# Patient Record
Sex: Female | Born: 1950 | Race: White | Hispanic: No | Marital: Married | Smoking: Former smoker
Health system: Southern US, Community
[De-identification: ages and names within clinical notes are randomized; demographics above are authoritative.]

## PROBLEM LIST (undated history)

## (undated) DIAGNOSIS — F329 Major depressive disorder, single episode, unspecified: Secondary | ICD-10-CM

## (undated) DIAGNOSIS — K219 Gastro-esophageal reflux disease without esophagitis: Secondary | ICD-10-CM

## (undated) DIAGNOSIS — F32A Depression, unspecified: Secondary | ICD-10-CM

## (undated) DIAGNOSIS — I10 Essential (primary) hypertension: Secondary | ICD-10-CM

## (undated) DIAGNOSIS — E7800 Pure hypercholesterolemia, unspecified: Secondary | ICD-10-CM

## (undated) HISTORY — DX: Major depressive disorder, single episode, unspecified: F32.9

## (undated) HISTORY — DX: Gastro-esophageal reflux disease without esophagitis: K21.9

## (undated) HISTORY — DX: Pure hypercholesterolemia, unspecified: E78.00

## (undated) HISTORY — PX: BUNIONECTOMY: SHX129

## (undated) HISTORY — PX: TOE SURGERY: SHX1073

## (undated) HISTORY — DX: Essential (primary) hypertension: I10

## (undated) HISTORY — PX: TONSILLECTOMY: SUR1361

## (undated) HISTORY — DX: Depression, unspecified: F32.A

---

## 1999-05-17 ENCOUNTER — Other Ambulatory Visit: Admission: RE | Admit: 1999-05-17 | Discharge: 1999-05-17 | Payer: Self-pay | Admitting: Gynecology

## 2000-10-11 ENCOUNTER — Other Ambulatory Visit: Admission: RE | Admit: 2000-10-11 | Discharge: 2000-10-11 | Payer: Self-pay | Admitting: Gynecology

## 2001-01-27 ENCOUNTER — Other Ambulatory Visit: Admission: RE | Admit: 2001-01-27 | Discharge: 2001-01-27 | Payer: Self-pay | Admitting: Gynecology

## 2002-06-08 ENCOUNTER — Other Ambulatory Visit: Admission: RE | Admit: 2002-06-08 | Discharge: 2002-06-08 | Payer: Self-pay | Admitting: Gynecology

## 2003-11-08 ENCOUNTER — Other Ambulatory Visit: Admission: RE | Admit: 2003-11-08 | Discharge: 2003-11-08 | Payer: Self-pay | Admitting: Gynecology

## 2005-04-30 ENCOUNTER — Other Ambulatory Visit: Admission: RE | Admit: 2005-04-30 | Discharge: 2005-04-30 | Payer: Self-pay | Admitting: Gynecology

## 2006-05-01 ENCOUNTER — Other Ambulatory Visit: Admission: RE | Admit: 2006-05-01 | Discharge: 2006-05-01 | Payer: Self-pay | Admitting: Gynecology

## 2008-04-29 ENCOUNTER — Other Ambulatory Visit: Admission: RE | Admit: 2008-04-29 | Discharge: 2008-04-29 | Payer: Self-pay | Admitting: Gynecology

## 2010-05-04 ENCOUNTER — Other Ambulatory Visit: Admission: RE | Admit: 2010-05-04 | Discharge: 2010-05-04 | Payer: Self-pay | Admitting: Gynecology

## 2010-05-04 ENCOUNTER — Ambulatory Visit: Payer: Self-pay | Admitting: Gynecology

## 2010-08-16 ENCOUNTER — Ambulatory Visit: Payer: Self-pay | Admitting: Gynecology

## 2011-08-24 ENCOUNTER — Encounter: Payer: Self-pay | Admitting: Gynecology

## 2011-10-19 ENCOUNTER — Other Ambulatory Visit: Payer: Self-pay | Admitting: Orthopedic Surgery

## 2011-10-19 DIAGNOSIS — M25531 Pain in right wrist: Secondary | ICD-10-CM

## 2011-10-25 ENCOUNTER — Other Ambulatory Visit: Payer: Self-pay

## 2012-02-26 ENCOUNTER — Telehealth: Payer: Self-pay | Admitting: *Deleted

## 2012-02-26 NOTE — Telephone Encounter (Signed)
Pt called requesting rubella screen test, pt informed to fill out medical release form and result will be mailed to pt home. Pt will do as directed.

## 2012-06-05 ENCOUNTER — Telehealth: Payer: Self-pay | Admitting: *Deleted

## 2012-06-05 ENCOUNTER — Ambulatory Visit (INDEPENDENT_AMBULATORY_CARE_PROVIDER_SITE_OTHER): Payer: BC Managed Care – PPO | Admitting: Gynecology

## 2012-06-05 ENCOUNTER — Encounter: Payer: Self-pay | Admitting: Gynecology

## 2012-06-05 VITALS — BP 126/84 | Ht 62.5 in | Wt 116.0 lb

## 2012-06-05 DIAGNOSIS — E7800 Pure hypercholesterolemia, unspecified: Secondary | ICD-10-CM

## 2012-06-05 DIAGNOSIS — M810 Age-related osteoporosis without current pathological fracture: Secondary | ICD-10-CM

## 2012-06-05 DIAGNOSIS — I10 Essential (primary) hypertension: Secondary | ICD-10-CM | POA: Insufficient documentation

## 2012-06-05 DIAGNOSIS — Z01419 Encounter for gynecological examination (general) (routine) without abnormal findings: Secondary | ICD-10-CM

## 2012-06-05 DIAGNOSIS — F329 Major depressive disorder, single episode, unspecified: Secondary | ICD-10-CM | POA: Insufficient documentation

## 2012-06-05 DIAGNOSIS — Z131 Encounter for screening for diabetes mellitus: Secondary | ICD-10-CM

## 2012-06-05 LAB — CBC WITH DIFFERENTIAL/PLATELET
Basophils Absolute: 0.1 10*3/uL (ref 0.0–0.1)
HCT: 39.4 % (ref 36.0–46.0)
Hemoglobin: 12.6 g/dL (ref 12.0–15.0)
Lymphocytes Relative: 23 % (ref 12–46)
Monocytes Absolute: 0.4 10*3/uL (ref 0.1–1.0)
Neutro Abs: 3.3 10*3/uL (ref 1.7–7.7)
Neutrophils Relative %: 61 % (ref 43–77)
RDW: 13.6 % (ref 11.5–15.5)
WBC: 5.3 10*3/uL (ref 4.0–10.5)

## 2012-06-05 LAB — COMPREHENSIVE METABOLIC PANEL
ALT: 14 U/L (ref 0–35)
AST: 27 U/L (ref 0–37)
Albumin: 4.4 g/dL (ref 3.5–5.2)
Alkaline Phosphatase: 54 U/L (ref 39–117)
BUN: 12 mg/dL (ref 6–23)
Chloride: 102 mEq/L (ref 96–112)
Potassium: 4.4 mEq/L (ref 3.5–5.3)
Sodium: 138 mEq/L (ref 135–145)

## 2012-06-05 LAB — LIPID PANEL
Cholesterol: 258 mg/dL — ABNORMAL HIGH (ref 0–200)
LDL Cholesterol: 167 mg/dL — ABNORMAL HIGH (ref 0–99)
Total CHOL/HDL Ratio: 3.3 Ratio
VLDL: 12 mg/dL (ref 0–40)

## 2012-06-05 MED ORDER — RISEDRONATE SODIUM 150 MG PO TABS: 150.0000 mg | ORAL_TABLET | ORAL | Status: AC

## 2012-06-05 MED ORDER — NONFORMULARY OR COMPOUNDED ITEM: Status: DC

## 2012-06-05 NOTE — Addendum Note (Signed)
Addended by: Dara Lords on: 06/05/2012 12:18 PM   Modules accepted: Orders

## 2012-06-05 NOTE — Progress Notes (Signed)
Melanie Eaton 01-Jan-1951 [id number]        61 y.o.  [id number] for annual exam.  Several issues noted below.  Past medical history,surgical history, medications, allergies, family history and social history were all reviewed and documented in the EPIC chart. ROS:  Was performed and pertinent positives and negatives are included in the history.  Exam: Melanie Eaton assistant Filed Vitals:   06/05/12 0948  BP: 126/84  Height: 5' 2.5" (1.588 m)  Weight: 116 lb (52.617 kg)   General appearance  Normal Skin grossly normal Head/Neck normal with no cervical or supraclavicular adenopathy thyroid normal Lungs  clear Cardiac RR, without RMG Abdominal  soft, nontender, without masses, organomegaly or hernia Breasts  examined lying and sitting without masses, retractions, discharge or axillary adenopathy. Pelvic  Ext/BUS/vagina  normal with atrophic changes  Cervix  normal with atrophic changes  Uterus  axial, normal size, shape and contour, midline and mobile nontender   Adnexa  Without masses or tenderness    Anus and perineum  normal   Rectovaginal  normal sphincter tone without palpated masses or tenderness.    Assessment/Plan:  61 y.o. [id number] female for annual exam.   1. Decreased libido. Patient complaining of decreased libido which is bothersome to her husband. I reviewed the pathophysiology and options to include expectant management, behavior modification, hormonal treatment with estrogen/testosterone or testosterone alone. The issues of hormone replacement WHI study risk of stroke heart attack DVT increased risk of breast cancer all reviewed. The issues of testosterone replacement and side effect profile to include weight change acne hair growth unfavorable lipid profile given she is already treated for hypercholesterolemia. All this was discussed. Patient wants trial of testosterone a suggested 2% cream applied periclitorally nightly. 2. Osteoporosis. DEXA October 2011 showed T score -2.9. Had  tried both Fosamax and Actonel transiently but stopped ask about Prolia and Reclast from a convenience standpoint. I received daily again the risks benefits to include osteonecrosis of the jaw, atypical fractures, GERD esophageal cancer rashes overall infection issues all reviewed. My recommendation would be to restart Actonel monthly if convenience is the only issue and she agrees with this. Annual prescription provided. Schedule repeat DEXA after October 2 year interval.  Vitamin D level ordered. 3. Mammography. Patient is due in October and she knows to schedule this. SBE monthly reviewed. 4. Colonoscopy. Patient knows to schedule it this year if she is doing agrees to do so. 5. Pap smear. Last Pap smear 2011. No Pap smear done today. Patient has no history of abnormal Pap smears and will plan less frequent screening every 3-5 years. 6. Health maintenance. Patient has that I would do baseline labs and CBC comp as metabolic panel lipid profile urinalysis and vitamin D level ordered. Follow up in one year, sooner as needed.    Melanie Lords MD, 11:04 AM 06/05/2012

## 2012-06-05 NOTE — Telephone Encounter (Signed)
Message copied by Aura Camps on Thu Jun 05, 2012 12:21 PM ------      Message from: Dara Lords      Created: Thu Jun 05, 2012 12:19 PM       Call patient and tell her that [facility] will make for her and I sent them the prescrition a

## 2012-06-05 NOTE — Patient Instructions (Signed)
Start Actonel monthly as we discussed. Start testosterone cream nightly fingertip amount applied around clitoral region.  May decrease to several times weekly if achieves desirable result. Follow up in one year for annual gynecologic exam.

## 2012-06-05 NOTE — Telephone Encounter (Signed)
Left the below note on pt voicemail. 

## 2012-06-06 ENCOUNTER — Other Ambulatory Visit: Payer: Self-pay | Admitting: Gynecology

## 2012-06-06 ENCOUNTER — Telehealth: Payer: Self-pay | Admitting: *Deleted

## 2012-06-06 LAB — URINALYSIS W MICROSCOPIC + REFLEX CULTURE
Bacteria, UA: NONE SEEN
Bilirubin Urine: NEGATIVE
Crystals: NONE SEEN
Glucose, UA: NEGATIVE mg/dL
Ketones, ur: NEGATIVE mg/dL
Protein, ur: NEGATIVE mg/dL
Squamous Epithelial / LPF: NONE SEEN

## 2012-06-06 MED ORDER — IBANDRONATE SODIUM 150 MG PO TABS: 150.0000 mg | ORAL_TABLET | ORAL | Status: AC

## 2012-06-06 NOTE — Telephone Encounter (Signed)
Prior authorization approved until 06/05/2013 for Actonel 150 mg tablets [facility] pharmacy informed with this as well.

## 2012-06-10 ENCOUNTER — Encounter: Payer: Self-pay | Admitting: Gynecology

## 2012-08-19 ENCOUNTER — Telehealth: Payer: Self-pay | Admitting: *Deleted

## 2012-08-19 NOTE — Telephone Encounter (Signed)
(  pt aware you are out of the office)Pt calling c/o yeast infection, itching, white discharge. 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 Please advise

## 2012-08-20 MED ORDER — FLUCONAZOLE 150 MG PO TABS: 150.0000 mg | ORAL_TABLET | Freq: Once | ORAL | Status: DC

## 2012-08-20 NOTE — Telephone Encounter (Signed)
Left on message rx sent.

## 2012-08-20 NOTE — Telephone Encounter (Signed)
Diflucan 150mg x 1

## 2012-08-27 ENCOUNTER — Encounter: Payer: Self-pay | Admitting: Gynecology

## 2013-06-09 ENCOUNTER — Other Ambulatory Visit: Payer: Self-pay | Admitting: Gynecology

## 2013-06-09 ENCOUNTER — Other Ambulatory Visit (HOSPITAL_COMMUNITY)
Admission: RE | Admit: 2013-06-09 | Discharge: 2013-06-09 | Disposition: A | Discharge status: Home / Self Care | Payer: BC Managed Care – PPO | Source: Ambulatory Visit | Attending: Gynecology | Admitting: Gynecology

## 2013-06-09 ENCOUNTER — Encounter: Payer: Self-pay | Admitting: Gynecology

## 2013-06-09 ENCOUNTER — Ambulatory Visit (INDEPENDENT_AMBULATORY_CARE_PROVIDER_SITE_OTHER): Payer: BC Managed Care – PPO | Admitting: Gynecology

## 2013-06-09 VITALS — BP 120/76 | Ht 62.0 in | Wt 116.0 lb

## 2013-06-09 DIAGNOSIS — Z01419 Encounter for gynecological examination (general) (routine) without abnormal findings: Secondary | ICD-10-CM

## 2013-06-09 DIAGNOSIS — Z1151 Encounter for screening for human papillomavirus (HPV): Secondary | ICD-10-CM | POA: Insufficient documentation

## 2013-06-09 DIAGNOSIS — M810 Age-related osteoporosis without current pathological fracture: Secondary | ICD-10-CM

## 2013-06-09 DIAGNOSIS — N760 Acute vaginitis: Secondary | ICD-10-CM

## 2013-06-09 DIAGNOSIS — Z13220 Encounter for screening for lipoid disorders: Secondary | ICD-10-CM

## 2013-06-09 LAB — CBC WITH DIFFERENTIAL/PLATELET
Basophils Absolute: 0.1 10*3/uL (ref 0.0–0.1)
Basophils Relative: 1 % (ref 0–1)
Eosinophils Absolute: 0.5 10*3/uL (ref 0.0–0.7)
MCH: 31.7 pg (ref 26.0–34.0)
MCHC: 33.9 g/dL (ref 30.0–36.0)
Neutrophils Relative %: 61 % (ref 43–77)
Platelets: 233 10*3/uL (ref 150–400)
RDW: 13.5 % (ref 11.5–15.5)

## 2013-06-09 LAB — COMPREHENSIVE METABOLIC PANEL
ALT: 15 U/L (ref 0–35)
Alkaline Phosphatase: 48 U/L (ref 39–117)
Glucose, Bld: 77 mg/dL (ref 70–99)
Sodium: 135 mEq/L (ref 135–145)
Total Bilirubin: 1.1 mg/dL (ref 0.3–1.2)
Total Protein: 6.5 g/dL (ref 6.0–8.3)

## 2013-06-09 LAB — TSH: TSH: 3.367 u[IU]/mL (ref 0.350–4.500)

## 2013-06-09 LAB — LIPID PANEL
LDL Cholesterol: 170 mg/dL — ABNORMAL HIGH (ref 0–99)
Triglycerides: 69 mg/dL (ref <150)
VLDL: 14 mg/dL (ref 0–40)

## 2013-06-09 MED ORDER — RISEDRONATE SODIUM 150 MG PO TABS: 150.0000 mg | ORAL_TABLET | ORAL | Status: DC

## 2013-06-09 MED ORDER — FLUCONAZOLE 150 MG PO TABS: 150.0000 mg | ORAL_TABLET | Freq: Once | ORAL | Status: DC

## 2013-06-09 NOTE — Progress Notes (Signed)
Melanie Eaton [birth year] [id number]        62 y.o.  [id number] for annual exam.  Several issues noted below.  Past medical history,surgical history, medications, allergies, family history and social history were all reviewed and documented in the EPIC chart.  ROS:  Performed and pertinent positives and negatives are included in the history, assessment and plan .  Exam: Kim assistant Filed Vitals:   06/09/13 1132  BP: 120/76  Height: 5\' 2"  (1.575 m)  Weight: 116 lb (52.617 kg)   General appearance  Normal Skin grossly normal Head/Neck normal with no cervical or supraclavicular adenopathy thyroid normal Lungs  clear Cardiac RR, without RMG Abdominal  soft, nontender, without masses, organomegaly or hernia Breasts  examined lying and sitting without masses, retractions, discharge or axillary adenopathy. Pelvic  Ext/BUS/vagina  normal with atrophic changes  Cervix  normal with atrophic changes  Uterus  anteverted, normal size, shape and contour, midline and mobile nontender   Adnexa  Without masses or tenderness    Anus and perineum  normal   Rectovaginal  normal sphincter tone without palpated masses or tenderness.    Assessment/Plan:  62 y.o. [id number] female for annual exam.   1. Postmenopausal/atrophic vaginitis. Overall doing well without significant hot flashes night sweats vaginal dryness or dyspareunia. Continue to monitor. Report any bleeding. 2. Osteoporosis. DEXA 08/2010 with T score -2.9. Most supposed to repeat last year but never did. Transiently started Actonel last year but stopped. Not an issue of side effects but she states having trouble remembering. I had a frank discussion with her about the devastating consequences of osteoporosis and fracture. Unable to reverse the changes discussed. The real need for treatment now reviewed. I again discussed the risks of osteonecrosis of the jaw, atypical fractures, GERD and esophageal disease. The patient promises me she'll restart  the Actonel and I refilled her times a year. We'll check baseline TSH PTH vitamin D and I again scheduled the DEXA and she agrees to followup and schedule this and knows the importance of doing so. 3. Mammography 08/2012. Patient will schedule repeat mammography this October. SBE monthly reviewed. 4. Pap smear 2011. Pap/HPV today. No history of abnormal Pap smears previously. Plan repeat assuming this one normal at 3-5 year interval. 5. Colonoscopy never. I again reviewed the benefits of early detection and precancerous lesion removal. I discussed the common nature of colon cancer and the need to schedule a colonoscopy. Patient promised me she would schedule colonoscopy this year.  6. Decreased libido. Tried testosterone cream previously without effect. No active intervention at this time. 7. Health maintenance. The patient asked if I would do her routine blood work as she is fasting and she will forward to her primary physician via MyChart as she is being followed for medical issues. Baseline CBC comprehensive metabolic panel lipid profile urinalysis along with her TSH vitamin D PTH.  Note: This document was prepared with digital dictation and possible smart phrase technology. Any transcriptional errors that result from this process are unintentional.   Dara Lords MD, 11:58 AM 06/09/2013

## 2013-06-09 NOTE — Patient Instructions (Signed)
Schedule screening colonoscopy. Schedule bone density. Restart Actonel. Followup with your medical doctor for your medical issues. Followup with me in one year, sooner as needed.

## 2013-06-09 NOTE — Addendum Note (Signed)
Addended by: Dayna Barker on: 06/09/2013 12:23 PM   Modules accepted: Orders

## 2013-06-10 LAB — URINALYSIS W MICROSCOPIC + REFLEX CULTURE
Crystals: NONE SEEN
Nitrite: NEGATIVE
Specific Gravity, Urine: 1.02 (ref 1.005–1.030)
Urobilinogen, UA: 1 mg/dL (ref 0.0–1.0)

## 2013-06-11 LAB — URINE CULTURE: Organism ID, Bacteria: NO GROWTH

## 2014-08-20 ENCOUNTER — Telehealth: Payer: Self-pay | Admitting: *Deleted

## 2014-08-20 NOTE — Telephone Encounter (Signed)
I have an appointment with Dr. Paulla Dolly next week.  My foot is bothering me again.  I wanted to know if he could go ahead and call an anti-inflammatory in for me.  I have an appointment on Wednesday.  If you would have him do that before I get in there, that would be great.

## 2014-08-22 NOTE — Telephone Encounter (Signed)
[id number] q.d. #[phone]

## 2014-08-23 MED ORDER — MELOXICAM 15 MG PO TABS: 15.0000 mg | ORAL_TABLET | Freq: Every day | ORAL | Status: DC

## 2014-08-23 NOTE — Telephone Encounter (Signed)
I left her a message that Dr. Josephina Shih the prescription for Mobic.  I'm sending it to [facility] in [place].  Call if you have any further questions.

## 2014-08-25 ENCOUNTER — Ambulatory Visit (INDEPENDENT_AMBULATORY_CARE_PROVIDER_SITE_OTHER): Payer: BC Managed Care – PPO | Admitting: Podiatry

## 2014-08-25 ENCOUNTER — Ambulatory Visit (INDEPENDENT_AMBULATORY_CARE_PROVIDER_SITE_OTHER): Payer: BC Managed Care – PPO

## 2014-08-25 ENCOUNTER — Encounter: Payer: Self-pay | Admitting: Podiatry

## 2014-08-25 ENCOUNTER — Telehealth: Payer: Self-pay | Admitting: *Deleted

## 2014-08-25 VITALS — BP 160/96 | HR 73 | Resp 16

## 2014-08-25 DIAGNOSIS — M79673 Pain in unspecified foot: Secondary | ICD-10-CM

## 2014-08-25 DIAGNOSIS — M2021 Hallux rigidus, right foot: Secondary | ICD-10-CM

## 2014-08-25 DIAGNOSIS — M779 Enthesopathy, unspecified: Secondary | ICD-10-CM

## 2014-08-25 MED ORDER — TRIAMCINOLONE ACETONIDE 10 MG/ML IJ SUSP
10.0000 mg | Freq: Once | INTRAMUSCULAR | Status: AC
  Administered 2014-08-25: 10 mg

## 2014-08-25 NOTE — Telephone Encounter (Signed)
"  I was just in there about 10 minutes ago.  I saw Dr. Paulla Dolly.  He was supposed to give me an anti-inflammatory. When I walked out I forgot about it.  I got fitted for orthotics.  If you would call me back.  Thanks a lot."  I called and informed her that we had sent a prescription to [facility] on yesterday.  (It was actually sent on Monday.)  She stated, "Okay thank you."

## 2014-08-26 NOTE — Progress Notes (Signed)
Subjective:     Patient ID: Melanie Eaton, female   DOB: 29-Nov-1950, 63 y.o.   MRN: [id number]  HPI patient presents stating I'm doing okay but having pain in my big toe joint right foot that just started recently but did very well with medication. The orthotics help me a lot   Review of Systems     Objective:   Physical Exam Neurovascular status intact with muscle strength adequate range of motion within normal limits. 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 Ultimately she knows this will require correction but at this time we'll continue to treat can certainly and I injected around the right first MPJ 3 mg Kenalog 5 mg Xylocaine and I went ahead and I scanned for new type orthotics with a graphite extension it's. Reappoint when ready and she will want a second pair we will make that decision based on response

## 2014-09-06 ENCOUNTER — Encounter: Payer: Self-pay | Admitting: Podiatry

## 2014-09-16 ENCOUNTER — Telehealth: Payer: Self-pay | Admitting: *Deleted

## 2014-09-16 NOTE — Telephone Encounter (Signed)
"  Calling to see if my orthotics are in.  If you'd return my message, I would appreciate it."  I called and left her a message that her orthotics are here.  Please give [place] a call to schedule an appointment to pick them up.

## 2014-09-20 ENCOUNTER — Telehealth: Payer: Self-pay | Admitting: *Deleted

## 2014-09-20 NOTE — Telephone Encounter (Signed)
Pt called requesting order faxed to [facility] for bone density. Order faxed pt will call to schedule.

## 2014-09-23 ENCOUNTER — Encounter: Payer: Self-pay | Admitting: Podiatry

## 2014-09-24 ENCOUNTER — Other Ambulatory Visit: Payer: BC Managed Care – PPO

## 2014-11-03 ENCOUNTER — Encounter: Payer: Self-pay | Admitting: Gynecology

## 2014-11-10 ENCOUNTER — Encounter: Payer: Self-pay | Admitting: Gynecology

## 2014-11-18 ENCOUNTER — Encounter: Payer: BC Managed Care – PPO | Admitting: Gynecology

## 2014-11-29 ENCOUNTER — Encounter: Payer: Self-pay | Admitting: Gynecology

## 2014-12-27 ENCOUNTER — Ambulatory Visit (INDEPENDENT_AMBULATORY_CARE_PROVIDER_SITE_OTHER): Payer: BC Managed Care – PPO | Admitting: Gynecology

## 2014-12-27 ENCOUNTER — Encounter: Payer: Self-pay | Admitting: Gynecology

## 2014-12-27 VITALS — BP 120/76 | Ht 63.0 in | Wt 124.0 lb

## 2014-12-27 DIAGNOSIS — E7800 Pure hypercholesterolemia, unspecified: Secondary | ICD-10-CM

## 2014-12-27 DIAGNOSIS — Z01419 Encounter for gynecological examination (general) (routine) without abnormal findings: Secondary | ICD-10-CM

## 2014-12-27 DIAGNOSIS — M810 Age-related osteoporosis without current pathological fracture: Secondary | ICD-10-CM

## 2014-12-27 LAB — LIPID PANEL
CHOL/HDL RATIO: 3 ratio
CHOLESTEROL: 274 mg/dL — AB (ref 0–200)
HDL: 91 mg/dL (ref >=46)
LDL Cholesterol: 168 mg/dL — ABNORMAL HIGH (ref 0–99)
TRIGLYCERIDES: 75 mg/dL (ref <150)
VLDL: 15 mg/dL (ref 0–40)

## 2014-12-27 LAB — CBC WITH DIFFERENTIAL/PLATELET
BASOS PCT: 1 % (ref 0–1)
Basophils Absolute: 0 10*3/uL (ref 0.0–0.1)
EOS PCT: 10 % — AB (ref 0–5)
Eosinophils Absolute: 0.5 10*3/uL (ref 0.0–0.7)
HCT: 40.9 % (ref 36.0–46.0)
Hemoglobin: 13.7 g/dL (ref 12.0–15.0)
LYMPHS ABS: 1.1 10*3/uL (ref 0.7–4.0)
Lymphocytes Relative: 25 % (ref 12–46)
MCH: 32.3 pg (ref 26.0–34.0)
MCHC: 33.5 g/dL (ref 30.0–36.0)
MCV: 96.5 fL (ref 78.0–100.0)
MPV: 9.1 fL (ref 8.6–12.4)
Monocytes Absolute: 0.4 10*3/uL (ref 0.1–1.0)
Monocytes Relative: 8 % (ref 3–12)
NEUTROS ABS: 2.5 10*3/uL (ref 1.7–7.7)
Neutrophils Relative %: 56 % (ref 43–77)
Platelets: 217 10*3/uL (ref 150–400)
RBC: 4.24 MIL/uL (ref 3.87–5.11)
RDW: 13 % (ref 11.5–15.5)
WBC: 4.5 10*3/uL (ref 4.0–10.5)

## 2014-12-27 LAB — COMPREHENSIVE METABOLIC PANEL
ALK PHOS: 49 U/L (ref 39–117)
ALT: 12 U/L (ref 0–35)
AST: 21 U/L (ref 0–37)
Albumin: 4.4 g/dL (ref 3.5–5.2)
BUN: 16 mg/dL (ref 6–23)
CO2: 28 mEq/L (ref 19–32)
Calcium: 9.3 mg/dL (ref 8.4–10.5)
Chloride: 100 mEq/L (ref 96–112)
Creat: 0.74 mg/dL (ref 0.50–1.10)
Glucose, Bld: 90 mg/dL (ref 70–99)
Potassium: 4.4 mEq/L (ref 3.5–5.3)
Sodium: 137 mEq/L (ref 135–145)
Total Bilirubin: 0.7 mg/dL (ref 0.2–1.2)
Total Protein: 6.5 g/dL (ref 6.0–8.3)

## 2014-12-27 LAB — TSH: TSH: 3.741 u[IU]/mL (ref 0.350–4.500)

## 2014-12-27 MED ORDER — RISEDRONATE SODIUM 150 MG PO TABS: 150.0000 mg | ORAL_TABLET | ORAL | Status: DC

## 2014-12-27 MED ORDER — FLUCONAZOLE 150 MG PO TABS: 150.0000 mg | ORAL_TABLET | Freq: Once | ORAL | Status: DC

## 2014-12-27 NOTE — Patient Instructions (Signed)
Schedule colonoscopy with [facility] at [phone] or [facility] at [phone]  Start on the monthly Actonel bone medication. Call me if you have any issues.  You may obtain a copy of any labs that were done today by logging onto MyChart as outlined in the instructions provided with your AVS (after visit summary). The office will not call with normal lab results but certainly if there are any significant abnormalities then we will contact you.   Health Maintenance, Female A healthy lifestyle and preventative care can promote health and wellness.  Maintain regular health, dental, and eye exams.  Eat a healthy diet. Foods like vegetables, fruits, whole grains, low-fat dairy products, and lean protein foods contain the nutrients you need without too many calories. Decrease your intake of foods high in solid fats, added sugars, and salt. Get information about a proper diet from your caregiver, if necessary.  Regular physical exercise is one of the most important things you can do for your health. Most adults should get at least 150 minutes of moderate-intensity exercise (any activity that increases your heart rate and causes you to sweat) each week. In addition, most adults need muscle-strengthening exercises on 2 or more days a week.   Maintain a healthy weight. The body mass index (BMI) is a screening tool to identify possible weight problems. It provides an estimate of body fat based on height and weight. Your caregiver can help determine your BMI, and can help you achieve or maintain a healthy weight. For adults 20 years and older:  A BMI below 18.5 is considered underweight.  A BMI of 18.5 to 24.9 is normal.  A BMI of 25 to 29.9 is considered overweight.  A BMI of 30 and above is considered obese.  Maintain normal blood lipids and cholesterol by exercising and minimizing your intake of saturated fat. Eat a balanced diet with plenty of fruits and vegetables.  Blood tests for lipids and cholesterol should begin at age 55 and be repeated every 5 years. If your lipid or cholesterol levels are high, you are over 50, or you are a high risk for heart disease, you may need your cholesterol levels checked more frequently.Ongoing high lipid and cholesterol levels should be treated with medicines if diet and exercise are not effective.  If you smoke, find out from your caregiver how to quit. If you do not use tobacco, do not start.  Lung cancer screening is recommended for adults aged 45 80 years who are at high risk for developing lung cancer because of a history of smoking. Yearly low-dose computed tomography (CT) is recommended for people who have at least a 30-pack-year history of smoking and are a current smoker or have quit within the past 15 years. A pack year of smoking is smoking an average of 1 pack of cigarettes a day for 1 year (for example: 1 pack a day for 30 years or 2 packs a day for 15 years). Yearly screening should continue until the smoker has stopped smoking for at least 15 years. Yearly screening should also be stopped for people who develop a health problem that would prevent them from having lung cancer treatment.  If you are pregnant, do not drink alcohol. If you are breastfeeding, be very cautious about drinking alcohol. If you are not pregnant and choose to drink alcohol, do not exceed 1 drink per day. One drink is considered to be 12 ounces (355 mL) of beer, 5 ounces (148 mL) of wine, or 1.5  ounces (44 mL) of liquor.  Avoid use of street drugs. Do not share needles with anyone. Ask for help if you need support or instructions about stopping the use of drugs.  High blood pressure causes heart disease and increases the risk of stroke. Blood pressure should be checked at least every 1 to 2 years. Ongoing high blood pressure should be treated with medicines, if weight loss and exercise are not effective.  If you are 45 to 62 years old, ask your  caregiver if you should take aspirin to prevent strokes.  Diabetes screening involves taking a blood sample to check your fasting blood sugar level. This should be done once every 3 years, after age 35, if you are within normal weight and without risk factors for diabetes. Testing should be considered at a younger age or be carried out more frequently if you are overweight and have at least 1 risk factor for diabetes.  Breast cancer screening is essential preventative care for women. You should practice "breast self-awareness." This means understanding the normal appearance and feel of your breasts and may include breast self-examination. Any changes detected, no matter how small, should be reported to a caregiver. Women in their 53s and 30s should have a clinical breast exam (CBE) by a caregiver as part of a regular health exam every 1 to 3 years. After age 45, women should have a CBE every year. Starting at age 1, women should consider having a mammogram (breast X-ray) every year. Women who have a family history of breast cancer should talk to their caregiver about genetic screening. Women at a high risk of breast cancer should talk to their caregiver about having an MRI and a mammogram every year.  Breast cancer gene (BRCA)-related cancer risk assessment is recommended for women who have family members with BRCA-related cancers. BRCA-related cancers include breast, ovarian, tubal, and peritoneal cancers. Having family members with these cancers may be associated with an increased risk for harmful changes (mutations) in the breast cancer genes BRCA1 and BRCA2. Results of the assessment will determine the need for genetic counseling and BRCA1 and BRCA2 testing.  The Pap test is a screening test for cervical cancer. Women should have a Pap test starting at age 11. Between ages 13 and 53, Pap tests should be repeated every 2 years. Beginning at age 43, you should have a Pap test every 3 years as long as the  past 3 Pap tests have been normal. If you had a hysterectomy for a problem that was not cancer or a condition that could lead to cancer, then you no longer need Pap tests. If you are between ages 30 and 41, and you have had normal Pap tests going back 10 years, you no longer need Pap tests. If you have had past treatment for cervical cancer or a condition that could lead to cancer, you need Pap tests and screening for cancer for at least 20 years after your treatment. If Pap tests have been discontinued, risk factors (such as a new sexual partner) need to be reassessed to determine if screening should be resumed. Some women have medical problems that increase the chance of getting cervical cancer. In these cases, your caregiver may recommend more frequent screening and Pap tests.  The human papillomavirus (HPV) test is an additional test that may be used for cervical cancer screening. The HPV test looks for the virus that can cause the cell changes on the cervix. The cells collected during the Pap test  can be tested for HPV. The HPV test could be used to screen women aged 71 years and older, and should be used in women of any age who have unclear Pap test results. After the age of 36, women should have HPV testing at the same frequency as a Pap test.  Colorectal cancer can be detected and often prevented. Most routine colorectal cancer screening begins at the age of 37 and continues through age 55. However, your caregiver may recommend screening at an earlier age if you have risk factors for colon cancer. On a yearly basis, your caregiver may provide home test kits to check for hidden blood in the stool. Use of a small camera at the end of a tube, to directly examine the colon (sigmoidoscopy or colonoscopy), can detect the earliest forms of colorectal cancer. Talk to your caregiver about this at age 41, when routine screening begins. Direct examination of the colon should be repeated every 5 to 10 years through  age 12, unless early forms of pre-cancerous polyps or small growths are found.  Hepatitis C blood testing is recommended for all people born from [birth year] through 1963 and any individual with known risks for hepatitis C.  Practice safe sex. Use condoms and avoid high-risk sexual practices to reduce the spread of sexually transmitted infections (STIs). Sexually active women aged 34 and younger should be checked for Chlamydia, which is a common sexually transmitted infection. Older women with new or multiple partners should also be tested for Chlamydia. Testing for other STIs is recommended if you are sexually active and at increased risk.  Osteoporosis is a disease in which the bones lose minerals and strength with aging. This can result in serious bone fractures. The risk of osteoporosis can be identified using a bone density scan. Women ages 2 and over and women at risk for fractures or osteoporosis should discuss screening with their caregivers. Ask your caregiver whether you should be taking a calcium supplement or vitamin D to reduce the rate of osteoporosis.  Menopause can be associated with physical symptoms and risks. Hormone replacement therapy is available to decrease symptoms and risks. You should talk to your caregiver about whether hormone replacement therapy is right for you.  Use sunscreen. Apply sunscreen liberally and repeatedly throughout the day. You should seek shade when your shadow is shorter than you. Protect yourself by wearing long sleeves, pants, a wide-brimmed hat, and sunglasses year round, whenever you are outdoors.  Notify your caregiver of new moles or changes in moles, especially if there is a change in shape or color. Also notify your caregiver if a mole is larger than the size of a pencil eraser.  Stay current with your immunizations. Document Released: 05/07/2011 Document Revised: 02/16/2013 Document Reviewed: 05/07/2011 [facility] Patient Information 2014 [place].

## 2014-12-27 NOTE — Progress Notes (Signed)
Melanie Eaton May 26, 1951 [id number]        64 y.o.  [id number] for annual exam.  Several issues noted below.  Past medical history,surgical history, problem list, medications, allergies, family history and social history were all reviewed and documented as reviewed in the EPIC chart.  ROS:  Performed with pertinent positives and negatives included in the history, assessment and plan.   Additional significant findings :  none   Exam: Kim Counsellor Vitals:   12/27/14 1034  BP: 120/76  Height: 5\' 3"  (1.6 m)  Weight: 124 lb (56.246 kg)   General appearance:  Normal affect, orientation and appearance. Skin: Grossly normal HEENT: Without gross lesions.  No cervical or supraclavicular adenopathy. Thyroid normal.  Lungs:  Clear without wheezing, rales or rhonchi Cardiac: RR, without RMG Abdominal:  Soft, nontender, without masses, guarding, rebound, organomegaly or hernia Breasts:  Examined lying and sitting without masses, retractions, discharge or axillary adenopathy. Pelvic:  Ext/BUS/vagina with atrophic changes  Cervix with atrophic changes  Uterus anteverted, normal size, shape and contour, midline and mobile nontender   Adnexa  Without masses or tenderness    Anus and perineum  Normal   Rectovaginal  Normal sphincter tone without palpated masses or tenderness.    Assessment/Plan:  64 y.o. [id number] female for annual exam.   1. Postmenopausal/atrophic genital changes. Patient without significant symptoms of hot flashes, night sweats, vaginal dryness or any vaginal bleeding. Continue to monitor and report any vaginal bleeding. 2. Osteoporosis.  DEXA 10/2014 T score -2.8. Transiently took Actonel but stopped it. Normal vitamin D, TSH and PTH. I reviewed her increased risk of fracture and my recommendation to consider restarting the Actonel and she did well when she took it.  I again reviewed the risks to include GI, osteonecrosis of the jaw atypical fractures. Patient wants to go  ahead and start this I prescribed one pill with 12 refills. How to take the medication again reviewed. Plan repeat DEXA at a two-year interval. Check vitamin D level today. She does exercise on a regular basis. 3. Mammography 11/2014. Continue with annual mammography. SBE monthly reviewed. 4. Pap smear/HPV negative 06/2013.  No Pap smear done today.  No history of abnormal Pap smears previously. Plan repeat Pap smear at 3-5 year interval. 5. Colonoscopy never. I again strongly urged her to schedule a screening colonoscopy. Patient again promises to do this year. Benefits of early detection/precancerous polyp removal reviewed. Names and numbers provided. 6. Health maintenance. Baseline CBC comprehensive metabolic panel lipid profile urinalysis vitamin D TSH ordered. Follow up 1 year, sooner as needed.     [name] MD, 11:00 AM 12/27/2014

## 2014-12-28 ENCOUNTER — Other Ambulatory Visit: Payer: Self-pay | Admitting: Gynecology

## 2014-12-28 DIAGNOSIS — E559 Vitamin D deficiency, unspecified: Secondary | ICD-10-CM

## 2014-12-28 LAB — URINALYSIS W MICROSCOPIC + REFLEX CULTURE
Bacteria, UA: NONE SEEN
Bilirubin Urine: NEGATIVE
Casts: NONE SEEN
Crystals: NONE SEEN
GLUCOSE, UA: NEGATIVE mg/dL
Hgb urine dipstick: NEGATIVE
Ketones, ur: NEGATIVE mg/dL
Nitrite: NEGATIVE
Protein, ur: NEGATIVE mg/dL
SQUAMOUS EPITHELIAL / LPF: NONE SEEN
Specific Gravity, Urine: 1.012 (ref 1.005–1.030)
Urobilinogen, UA: 0.2 mg/dL (ref 0.0–1.0)
pH: 7 (ref 5.0–8.0)

## 2014-12-28 LAB — VITAMIN D 25 HYDROXY (VIT D DEFICIENCY, FRACTURES): VIT D 25 HYDROXY: 25 ng/mL — AB (ref 30–100)

## 2014-12-29 LAB — URINE CULTURE
Colony Count: NO GROWTH
Organism ID, Bacteria: NO GROWTH

## 2015-10-12 HISTORY — PX: OTHER SURGICAL HISTORY: SHX169

## 2015-11-30 ENCOUNTER — Ambulatory Visit (INDEPENDENT_AMBULATORY_CARE_PROVIDER_SITE_OTHER): Payer: BC Managed Care – PPO | Admitting: Podiatry

## 2015-11-30 ENCOUNTER — Encounter: Payer: Self-pay | Admitting: Podiatry

## 2015-11-30 VITALS — BP 139/89 | HR 60 | Resp 16

## 2015-11-30 DIAGNOSIS — M2021 Hallux rigidus, right foot: Secondary | ICD-10-CM

## 2015-11-30 DIAGNOSIS — M779 Enthesopathy, unspecified: Secondary | ICD-10-CM | POA: Diagnosis not present

## 2015-11-30 MED ORDER — TRIAMCINOLONE ACETONIDE 10 MG/ML IJ SUSP
10.0000 mg | Freq: Once | INTRAMUSCULAR | Status: AC
  Administered 2015-11-30: 10 mg

## 2015-11-30 MED ORDER — MELOXICAM 15 MG PO TABS: 15.0000 mg | ORAL_TABLET | Freq: Every day | ORAL | Status: DC

## 2015-12-01 NOTE — Progress Notes (Signed)
Subjective:     Patient ID: Melanie Eaton, female   DOB: [birth year], 65 y.o.   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

## 2015-12-21 ENCOUNTER — Ambulatory Visit: Payer: BC Managed Care – PPO | Admitting: *Deleted

## 2015-12-21 DIAGNOSIS — M779 Enthesopathy, unspecified: Secondary | ICD-10-CM

## 2015-12-21 NOTE — Patient Instructions (Signed)

## 2015-12-21 NOTE — Progress Notes (Signed)
Patient ID: Melanie Eaton, female   DOB: [birth year], 65 y.o.   MRN: [id number] Patient presents for orthotic pick up.  Verbal and written break in and wear instructions given.  Patient will follow up in 4 weeks if symptoms worsen or fail to improve.

## 2016-08-07 ENCOUNTER — Encounter: Payer: Self-pay | Admitting: Gynecology

## 2016-08-15 DIAGNOSIS — M5137 Other intervertebral disc degeneration, lumbosacral region: Secondary | ICD-10-CM | POA: Insufficient documentation

## 2016-08-15 DIAGNOSIS — R5381 Other malaise: Secondary | ICD-10-CM | POA: Insufficient documentation

## 2016-08-15 DIAGNOSIS — F988 Other specified behavioral and emotional disorders with onset usually occurring in childhood and adolescence: Secondary | ICD-10-CM | POA: Insufficient documentation

## 2016-08-15 DIAGNOSIS — M810 Age-related osteoporosis without current pathological fracture: Secondary | ICD-10-CM | POA: Insufficient documentation

## 2016-08-15 DIAGNOSIS — R5383 Other fatigue: Secondary | ICD-10-CM

## 2016-08-16 DIAGNOSIS — G250 Essential tremor: Secondary | ICD-10-CM | POA: Insufficient documentation

## 2016-11-13 ENCOUNTER — Telehealth: Payer: Self-pay | Admitting: *Deleted

## 2016-11-13 NOTE — Telephone Encounter (Signed)
Pt requested refill of Meloxicam. I left message informing pt if she was continuing to have a problem with pain that needed a refill of the antiinflammatory pain medication Meloxicam, she needed to schedule an appt.

## 2016-12-31 ENCOUNTER — Ambulatory Visit (INDEPENDENT_AMBULATORY_CARE_PROVIDER_SITE_OTHER): Payer: BC Managed Care – PPO | Admitting: Podiatry

## 2016-12-31 ENCOUNTER — Encounter: Payer: Self-pay | Admitting: Podiatry

## 2016-12-31 ENCOUNTER — Ambulatory Visit (INDEPENDENT_AMBULATORY_CARE_PROVIDER_SITE_OTHER): Payer: BC Managed Care – PPO

## 2016-12-31 VITALS — BP 152/91 | HR 66 | Resp 16

## 2016-12-31 DIAGNOSIS — M2021 Hallux rigidus, right foot: Secondary | ICD-10-CM | POA: Diagnosis not present

## 2016-12-31 DIAGNOSIS — M79671 Pain in right foot: Secondary | ICD-10-CM | POA: Diagnosis not present

## 2016-12-31 DIAGNOSIS — M779 Enthesopathy, unspecified: Secondary | ICD-10-CM

## 2016-12-31 DIAGNOSIS — M79672 Pain in left foot: Secondary | ICD-10-CM | POA: Diagnosis not present

## 2016-12-31 MED ORDER — MELOXICAM 15 MG PO TABS: 15.0000 mg | ORAL_TABLET | Freq: Every day | ORAL | 2 refills | Status: DC

## 2016-12-31 MED ORDER — TRIAMCINOLONE ACETONIDE 10 MG/ML IJ SUSP
10.0000 mg | Freq: Once | INTRAMUSCULAR | Status: AC
  Administered 2016-12-31: 10 mg

## 2017-01-02 NOTE — Progress Notes (Signed)
Subjective:     Patient ID: Melanie Eaton, female   DOB: 01/19/1951, 66 y.o.   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 At this time I went ahead and I injected around the capsule 3 mg Kenalog 5 mg Xylocaine and did scanned for new orthotic to try to reduce stress against the joint surface. I then educated her on hallux limitus and we discussed surgical procedures that may be necessary in this case  X-ray indicates moderate increase and spur size with narrowing the joint surface

## 2017-01-21 ENCOUNTER — Other Ambulatory Visit: Payer: BC Managed Care – PPO

## 2017-02-13 ENCOUNTER — Other Ambulatory Visit: Payer: BC Managed Care – PPO

## 2017-02-20 ENCOUNTER — Other Ambulatory Visit: Payer: BC Managed Care – PPO

## 2017-09-20 ENCOUNTER — Other Ambulatory Visit: Payer: Self-pay | Admitting: Gynecology

## 2017-09-20 DIAGNOSIS — Z1231 Encounter for screening mammogram for malignant neoplasm of breast: Secondary | ICD-10-CM

## 2017-10-15 ENCOUNTER — Ambulatory Visit: Payer: BC Managed Care – PPO | Admitting: Gynecology

## 2017-10-15 ENCOUNTER — Other Ambulatory Visit: Payer: Self-pay | Admitting: Gynecology

## 2017-10-15 ENCOUNTER — Encounter: Payer: Self-pay | Admitting: Gynecology

## 2017-10-15 VITALS — BP 120/76 | Ht 62.0 in | Wt 127.0 lb

## 2017-10-15 DIAGNOSIS — Z01411 Encounter for gynecological examination (general) (routine) with abnormal findings: Secondary | ICD-10-CM | POA: Diagnosis not present

## 2017-10-15 DIAGNOSIS — Z13220 Encounter for screening for lipoid disorders: Secondary | ICD-10-CM | POA: Diagnosis not present

## 2017-10-15 DIAGNOSIS — M810 Age-related osteoporosis without current pathological fracture: Secondary | ICD-10-CM

## 2017-10-15 NOTE — Progress Notes (Signed)
    Melanie Eaton 1951-05-25 [id number]        66 y.o.  [id number] for annual gynecologic exam.  Doing well without complaints.  Past medical history,surgical history, problem list, medications, allergies, family history and social history were all reviewed and documented as reviewed in the EPIC chart.  ROS:  Performed with pertinent positives and negatives included in the history, assessment and plan.   Additional significant findings : None   Exam: [name] assistant Vitals:   10/15/17 1341  BP: 120/76  Weight: 127 lb (57.6 kg)  Height: 5\' 2"  (1.575 m)   Body mass index is 23.23 kg/m.  General appearance:  Normal affect, orientation and appearance. Skin: Grossly normal HEENT: Without gross lesions.  No cervical or supraclavicular adenopathy. Thyroid normal.  Lungs:  Clear without wheezing, rales or rhonchi Cardiac: RR, without RMG Abdominal:  Soft, nontender, without masses, guarding, rebound, organomegaly or hernia Breasts:  Examined lying and sitting without masses, retractions, discharge or axillary adenopathy. Pelvic:  Ext, BUS, Vagina: With atrophic changes  Cervix: With atrophic changes.  Pap smear done  Uterus: Anteverted, normal size, shape and contour, midline and mobile nontender   Adnexa: Without masses or tenderness    Anus and perineum: Normal   Rectovaginal: Normal sphincter tone without palpated masses or tenderness.    Assessment/Plan:  66 y.o. [id number] female for annual gynecologic exam.   1. Postmenopausal/atrophic genital changes.  No significant hot flushes, night sweats, vaginal dryness or any vaginal bleeding.  Continue to monitor and report any issues or bleeding. 2. Osteoporosis.  DEXA 10/2014 T score -2.8.  Transiently took Actonel.  Discussed last year and she was to restart Actonel but never did this.  Has mammogram and DEXA scheduled end of this month.  We will further discuss after this DEXA. 3. Mammography 08/2016.  Scheduled end of this month.   We will follow-up for this.  Breast exam normal today.  SBE monthly reviewed. 4. Pap smear/HPV 06/2013.  Pap smear done today.  No history of abnormal Pap smears previously. 5. Colonoscopy 2016.  Repeat at their recommended interval. 6. Health maintenance.  Baseline CBC, CMP, lipid profile, TSH, vitamin D ordered.  Follow-up 1 year, sooner as needed.   [name] MD, 2:09 PM 10/15/2017

## 2017-10-15 NOTE — Patient Instructions (Signed)
Follow up for annual exam in one year 

## 2017-10-15 NOTE — Addendum Note (Signed)
Addended by: Nelva Nay on: 10/15/2017 02:27 PM   Modules accepted: Orders

## 2017-10-16 LAB — COMPREHENSIVE METABOLIC PANEL
AG Ratio: 2.6 (calc) — ABNORMAL HIGH (ref 1.0–2.5)
ALT: 12 U/L (ref 6–29)
AST: 24 U/L (ref 10–35)
Albumin: 4.5 g/dL (ref 3.6–5.1)
Alkaline phosphatase (APISO): 57 U/L (ref 33–130)
BUN: 15 mg/dL (ref 7–25)
CO2: 26 mmol/L (ref 20–32)
Calcium: 9.1 mg/dL (ref 8.6–10.4)
Chloride: 97 mmol/L — ABNORMAL LOW (ref 98–110)
Creat: 0.85 mg/dL (ref 0.50–0.99)
Globulin: 1.7 g/dL (calc) — ABNORMAL LOW (ref 1.9–3.7)
Glucose, Bld: 97 mg/dL (ref 65–99)
Potassium: 4 mmol/L (ref 3.5–5.3)
Sodium: 131 mmol/L — ABNORMAL LOW (ref 135–146)
Total Bilirubin: 0.7 mg/dL (ref 0.2–1.2)
Total Protein: 6.2 g/dL (ref 6.1–8.1)

## 2017-10-16 LAB — CBC WITH DIFFERENTIAL/PLATELET
BASOS PCT: 1.1 %
Basophils Absolute: 51 cells/uL (ref 0–200)
EOS ABS: 267 {cells}/uL (ref 15–500)
Eosinophils Relative: 5.8 %
HCT: 36.7 % (ref 35.0–45.0)
HEMOGLOBIN: 12.7 g/dL (ref 11.7–15.5)
Lymphs Abs: 1251 cells/uL (ref 850–3900)
MCH: 32.6 pg (ref 27.0–33.0)
MCHC: 34.6 g/dL (ref 32.0–36.0)
MCV: 94.1 fL (ref 80.0–100.0)
MPV: 9.5 fL (ref 7.5–12.5)
Monocytes Relative: 9.1 %
NEUTROS ABS: 2613 {cells}/uL (ref 1500–7800)
Neutrophils Relative %: 56.8 %
Platelets: 238 10*3/uL (ref 140–400)
RBC: 3.9 10*6/uL (ref 3.80–5.10)
RDW: 11.6 % (ref 11.0–15.0)
TOTAL LYMPHOCYTE: 27.2 %
WBC: 4.6 10*3/uL (ref 3.8–10.8)
WBCMIX: 419 {cells}/uL (ref 200–950)

## 2017-10-16 LAB — LIPID PANEL
CHOL/HDL RATIO: 2.6 (calc) (ref <5.0)
Cholesterol: 303 mg/dL — ABNORMAL HIGH (ref <200)
HDL: 116 mg/dL (ref >50)
LDL Cholesterol (Calc): 172 mg/dL (calc) — ABNORMAL HIGH
NON-HDL CHOLESTEROL (CALC): 187 mg/dL — AB (ref <130)
Triglycerides: 62 mg/dL (ref <150)

## 2017-10-16 LAB — TSH: TSH: 4 mIU/L (ref 0.40–4.50)

## 2017-10-16 LAB — VITAMIN D 25 HYDROXY (VIT D DEFICIENCY, FRACTURES): Vit D, 25-Hydroxy: 35 ng/mL (ref 30–100)

## 2017-10-17 LAB — PAP IG W/ RFLX HPV ASCU

## 2017-10-18 ENCOUNTER — Encounter: Payer: Self-pay | Admitting: Gynecology

## 2017-10-25 ENCOUNTER — Encounter: Payer: Self-pay | Admitting: Gynecology

## 2017-11-01 ENCOUNTER — Encounter: Payer: Self-pay | Admitting: Gynecology

## 2017-11-07 ENCOUNTER — Ambulatory Visit: Payer: BC Managed Care – PPO | Admitting: Podiatry

## 2017-11-07 ENCOUNTER — Other Ambulatory Visit: Payer: Self-pay | Admitting: Podiatry

## 2017-11-07 ENCOUNTER — Encounter: Payer: Self-pay | Admitting: Podiatry

## 2017-11-07 ENCOUNTER — Ambulatory Visit (INDEPENDENT_AMBULATORY_CARE_PROVIDER_SITE_OTHER): Payer: BC Managed Care – PPO

## 2017-11-07 DIAGNOSIS — M2021 Hallux rigidus, right foot: Secondary | ICD-10-CM | POA: Diagnosis not present

## 2017-11-07 DIAGNOSIS — M779 Enthesopathy, unspecified: Secondary | ICD-10-CM

## 2017-11-07 DIAGNOSIS — M79671 Pain in right foot: Secondary | ICD-10-CM

## 2017-11-07 MED ORDER — TRIAMCINOLONE ACETONIDE 10 MG/ML IJ SUSP
10.0000 mg | Freq: Once | INTRAMUSCULAR | Status: AC
  Administered 2017-11-07: 10 mg

## 2017-11-07 NOTE — Progress Notes (Signed)
Subjective:   Patient ID: Melanie Eaton, female   DOB: 67 y.o.   MRN: [id number]   HPI Patient presents stating she knows she is going to need to get this bunion fixed in the bone spur is getting worse and also she is having pain in her forefoot and thinks may be she is walking differently and states that she still is playing tennis   ROS      Objective:  Physical Exam  Neurovascular status intact with large spur of the first metatarsal head right with severe limitation of motion of the first MPJ with discomfort in the fourth MPJ right     Assessment:  Inflammatory capsulitis first MPJ right with severe structural hallux limitus rigidus deformity and bone spur formation along with inflammatory capsulitis most likely fourth MPJ right     Plan:  H&P x-ray reviewed and today I did a proximal nerve block of the right forefoot aspirated the fourth MPJ getting out a small amount of clear fluid and injected with 1/2 cc dexamethasone Kenalog and for the first I injected the first MPJ 3 mg Kenalog 5 mg Xylocaine and advised on anti-inflammatories.  Patient also will see Liliane Channel to have orthotics modified and wants to have a implant with removal of bone spur formation done later in the year which I educated her on today  X-rays indicate severe spurring of the first MPJ with narrowing of the joint surface and no indications of lateral pathology

## 2017-11-14 ENCOUNTER — Encounter: Payer: Self-pay | Admitting: Podiatry

## 2017-11-14 ENCOUNTER — Ambulatory Visit (INDEPENDENT_AMBULATORY_CARE_PROVIDER_SITE_OTHER): Payer: BC Managed Care – PPO | Admitting: Podiatry

## 2017-11-14 ENCOUNTER — Telehealth: Payer: Self-pay | Admitting: Podiatry

## 2017-11-14 DIAGNOSIS — M79671 Pain in right foot: Secondary | ICD-10-CM

## 2017-11-14 DIAGNOSIS — M7751 Other enthesopathy of right foot: Secondary | ICD-10-CM | POA: Diagnosis not present

## 2017-11-14 DIAGNOSIS — M779 Enthesopathy, unspecified: Secondary | ICD-10-CM

## 2017-11-14 MED ORDER — TRIAMCINOLONE ACETONIDE 10 MG/ML IJ SUSP
10.0000 mg | Freq: Once | INTRAMUSCULAR | Status: AC
  Administered 2017-11-14: 10 mg

## 2017-11-14 NOTE — Progress Notes (Signed)
Subjective:   Patient ID: Melanie Eaton, female   DOB: 67 y.o.   MRN: [id number]   HPI Patient presents stating the fourth toe was bothering her in the areas that we worked on her doing much better    ROS      Objective:  Physical Exam  Neurovascular status intact with diminishment of discomfort fourth MPJ right and around the first metatarsal head with patient found to have inflammation pain of the interphalangeal joint digit 4 right     Assessment:  Inflammatory capsulitis with chronic hallux limitus condition and capsulitis     Plan:  Reviewed condition and did careful steroidal injection of 2 mg Dexasone Kenalog 5 mg Xylocaine around the interphalangeal joint and applied padding.  Reappoint to recheck again depending on symptoms and will get a second pair of orthotics to try to offload the forefoot

## 2017-11-14 NOTE — Telephone Encounter (Signed)
Pt states she had already seen Dr. Paulla Dolly.

## 2017-11-14 NOTE — Telephone Encounter (Signed)
I see Dr. Paulla Dolly and I was in there last week with pain in my foot. I would like the nurse to call me back as I have a couple of questions. My number is [phone].

## 2017-11-14 NOTE — Progress Notes (Signed)
Melanie Eaton came in for an orthotic adjustment; the morton's extension was taking up too much room in her shoe; I ground down the morton's extension by about 1/16" as well as the eva bottom cover.  She seemed pleased with that.

## 2017-11-18 DIAGNOSIS — Z1159 Encounter for screening for other viral diseases: Secondary | ICD-10-CM | POA: Insufficient documentation

## 2017-11-28 ENCOUNTER — Telehealth: Payer: Self-pay | Admitting: Podiatry

## 2017-11-28 NOTE — Telephone Encounter (Signed)
I informed pt I had sent a message to Dr. Paulla Dolly and I would call again with information once he had responded.

## 2017-11-28 NOTE — Telephone Encounter (Signed)
I'm a pt of Dr. Mellody Drown. I've been in there twice this month about my foot. I wanted to know if you could talk to him and see if I came back up there would he give me another cortisone shot in my foot? My number is [phone]. Thank you.

## 2017-11-28 NOTE — Telephone Encounter (Signed)
Dr. Paulla Dolly states he will be able to inject her foot next week. I informed pt and transferred to the schedulers.

## 2017-12-02 ENCOUNTER — Ambulatory Visit: Payer: BC Managed Care – PPO | Admitting: Podiatry

## 2018-01-24 ENCOUNTER — Telehealth: Payer: Self-pay | Admitting: *Deleted

## 2018-01-24 MED ORDER — RISEDRONATE SODIUM 150 MG PO TABS: 150.0000 mg | ORAL_TABLET | ORAL | 11 refills | Status: DC

## 2018-01-24 NOTE — Telephone Encounter (Signed)
Pt called requesting to have Actonel 150 mg tablet sent to [facility] based on dexa results in 10/2017. Rx sent

## 2018-01-29 ENCOUNTER — Encounter: Payer: Self-pay | Admitting: Podiatry

## 2018-01-29 ENCOUNTER — Ambulatory Visit (INDEPENDENT_AMBULATORY_CARE_PROVIDER_SITE_OTHER): Payer: BC Managed Care – PPO

## 2018-01-29 ENCOUNTER — Ambulatory Visit: Payer: BC Managed Care – PPO | Admitting: Podiatry

## 2018-01-29 DIAGNOSIS — M21619 Bunion of unspecified foot: Secondary | ICD-10-CM

## 2018-01-29 DIAGNOSIS — M779 Enthesopathy, unspecified: Secondary | ICD-10-CM | POA: Diagnosis not present

## 2018-01-29 MED ORDER — TRIAMCINOLONE ACETONIDE 10 MG/ML IJ SUSP
10.0000 mg | Freq: Once | INTRAMUSCULAR | Status: AC
  Administered 2018-01-29: 10 mg

## 2018-01-30 NOTE — Progress Notes (Signed)
Subjective:   Patient ID: Melanie Eaton, female   DOB: 67 y.o.   MRN: [id number]   HPI Patient presents with continued pain in the right fourth MPJ that is making it increasingly hard for her to walk comfortably.  Patient states that the bunion is also bothersome and she knows she needs surgery on this but she needs to wait until later in the year   ROS      Objective:  Physical Exam  Neurovascular status intact with inflammation pain fourth MPJ right chronic in nature and large bunion structural deformity right first metatarsal that is painful when pressed and make shoe gear difficult with significant loss of motion first MPJ     Assessment:  Inflammatory capsulitis fourth MPJ right with large bone structural changes around the first metatarsal right with skin that is under stress secondary to the abnormal position     Plan:  H&P conditions reviewed at great length today I did a proximal nerve block aspirated the fourth MPJ getting out a small amount of clear fluid and injected quarter cc dexamethasone Kenalog.  I also and making new orthotics to try to continue to reduce the pressure on the fourth MPJ

## 2018-02-19 ENCOUNTER — Other Ambulatory Visit: Payer: BC Managed Care – PPO | Admitting: Orthotics

## 2018-03-17 ENCOUNTER — Ambulatory Visit: Payer: Medicare Other | Admitting: Orthotics

## 2018-03-17 DIAGNOSIS — M21619 Bunion of unspecified foot: Secondary | ICD-10-CM

## 2018-03-17 DIAGNOSIS — M779 Enthesopathy, unspecified: Secondary | ICD-10-CM

## 2018-03-17 DIAGNOSIS — M79671 Pain in right foot: Secondary | ICD-10-CM

## 2018-03-17 NOTE — Progress Notes (Signed)
Patient came in today to pick up custom made foot orthotics.  The goals were accomplished and the patient reported no dissatisfaction with said orthotics.  Patient was advised of breakin period and how to report any issues. 

## 2018-04-30 ENCOUNTER — Encounter: Payer: Self-pay | Admitting: Gastroenterology

## 2018-06-02 ENCOUNTER — Encounter: Payer: Self-pay | Admitting: Gastroenterology

## 2018-06-03 ENCOUNTER — Encounter: Payer: Self-pay | Admitting: Gastroenterology

## 2018-06-03 ENCOUNTER — Ambulatory Visit (INDEPENDENT_AMBULATORY_CARE_PROVIDER_SITE_OTHER): Payer: Medicare Other | Admitting: Gastroenterology

## 2018-06-03 VITALS — BP 138/84 | HR 65 | Ht 63.0 in | Wt 123.2 lb

## 2018-06-03 DIAGNOSIS — K219 Gastro-esophageal reflux disease without esophagitis: Secondary | ICD-10-CM

## 2018-06-03 DIAGNOSIS — R1310 Dysphagia, unspecified: Secondary | ICD-10-CM | POA: Diagnosis not present

## 2018-06-03 DIAGNOSIS — R1319 Other dysphagia: Secondary | ICD-10-CM

## 2018-06-03 MED ORDER — OMEPRAZOLE 40 MG PO CPDR: 40.0000 mg | DELAYED_RELEASE_CAPSULE | Freq: Every day | ORAL | 3 refills | Status: DC

## 2018-06-03 NOTE — Progress Notes (Signed)
Chief Complaint: Dysphagia  Referring Provider:  Raina Mina., MD      ASSESSMENT AND PLAN;   #1. GERD #2. Esophageal dysphagia. Differential diagnoses includes esophageal stricture, Schatzki's ring, motility disorder, eosinophilic esophagitis, pill induced esophagitis, rule out esophageal carcinoma or extrinsic lesions. Plan: - Omeprazole 20mg  po qd - Hold actonel for now. - Ba swallow with Ba tablet. - EGD with dil.  I have discussed the risks and benefits. - I have instructed patient that she needs to chew foods especially meats and breads well and eat slowly.    HPI:    Melanie Eaton is a 67 y.o. female  With history of intermittent solid food dysphagia, especially to meats and breads.  Never happens with liquids. In the lower chest. Occasionally has to throw up. Has been having intermittently over the last 3 to 4 years. Does have heartburn for which she uses omeprazole on a as needed basis. No hematemesis, melena or hematochezia. Not getting progressively worse. Episodic No odynophagia. No weight loss. Has family history of hiatal hernia.  Last colonoscopy 10/12/2015: Minimal sigmoid diverticulosis.  Repeat in 10 years, earlier in case of any problems.   Past Medical History:  Diagnosis Date  . Depression   . Hypercholesteremia   . Hypertension   . Osteoporosis 10/2017   T score -3.0 7% loss right femoral neck from prior study 2015    Past Surgical History:  Procedure Laterality Date  . BUNIONECTOMY    . CESAREAN SECTION    . Colonoscopy  10/12/2015   Mild sigmoid diverticulosis.   . TONSILLECTOMY      Family History  Problem Relation Age of Onset  . Hypertension Mother   . Hyperlipidemia Mother   . Hypertension Father   . Hyperlipidemia Father   . Cancer Brother 57       Parotid    Social History   Tobacco Use  . Smoking status: Former Research scientist (life sciences)  . Smokeless tobacco: Never Used  . Tobacco comment: college-Socially during college    Substance Use Topics  . Alcohol use: Yes    Alcohol/week: 2.4 oz    Types: 4 Standard drinks or equivalent per week  . Drug use: No    Current Outpatient Medications  Medication Sig Dispense Refill  . atenolol (TENORMIN) 25 MG tablet Take 25 mg by mouth daily.    Marland Kitchen buPROPion (WELLBUTRIN XL) 300 MG 24 hr tablet Take 300 mg by mouth daily.    . Calcium Carbonate-Vitamin D (CALCIUM + D PO) Take by mouth.    . Multiple Vitamin (MULTIVITAMIN) tablet Take 1 tablet by mouth daily.    . risedronate (ACTONEL) 150 MG tablet Take 1 tablet (150 mg total) by mouth every 30 (thirty) days. with water on empty stomach, nothing by mouth or lie down for next 30 minutes. 1 tablet 11  . simvastatin (ZOCOR) 20 MG tablet Take 20 mg by mouth at bedtime.       No current facility-administered medications for this visit.     Allergies  Allergen Reactions  . Penicillins Rash    Review of Systems:  Constitutional: Denies fever, chills, diaphoresis, appetite change and fatigue.  HEENT: Denies photophobia, eye pain, redness, hearing loss, ear pain, congestion, sore throat, rhinorrhea, sneezing, mouth sores, neck pain, neck stiffness and tinnitus.   Respiratory: Denies SOB, DOE, cough, chest tightness,  and wheezing.   Cardiovascular: Denies chest pain, palpitations and leg swelling.  Genitourinary: Denies dysuria, urgency, frequency, hematuria, flank pain  and difficulty urinating.  Musculoskeletal: Denies myalgias, back pain, joint swelling, arthralgias and gait problem.  Skin: No rash.  Neurological: Denies dizziness, seizures, syncope, weakness, light-headedness, numbness and headaches.  Hematological: Denies adenopathy. Easy bruising, personal or family bleeding history  Psychiatric/Behavioral: No anxiety or depression     Physical Exam:    BP 138/84   Pulse 65   Ht 5\' 3"  (1.6 m)   Wt 123 lb 4 oz (55.9 kg)   BMI 21.83 kg/m  Filed Weights   06/03/18 1402  Weight: 123 lb 4 oz (55.9 kg)    Constitutional:  Well-developed, in no acute distress. Psychiatric: Normal mood and affect. Behavior is normal. HEENT: Pupils normal.  Conjunctivae are normal. No scleral icterus. Neck supple.  Cardiovascular: Normal rate, regular rhythm. No edema Pulmonary/chest: Effort normal and breath sounds normal. No wheezing, rales or rhonchi. Abdominal: Soft, nondistended. Nontender. Bowel sounds active throughout. There are no masses palpable. No hepatomegaly. Rectal:  defered Neurological: Alert and oriented to person place and time. Skin: Skin is warm and dry. No rashes noted.  Data Reviewed: I have personally reviewed following labs and imaging studies  CBC: CBC Latest Ref Rng & Units 10/15/2017 12/27/2014 06/09/2013  WBC 3.8 - 10.8 Thousand/uL 4.6 4.5 5.7  Hemoglobin 11.7 - 15.5 g/dL 12.7 13.7 13.7  Hematocrit 35.0 - 45.0 % 36.7 40.9 40.4  Platelets 140 - 400 Thousand/uL 238 217 233    CMP: CMP Latest Ref Rng & Units 10/15/2017 12/27/2014 06/09/2013  Glucose 65 - 99 mg/dL 97 90 77  BUN 7 - 25 mg/dL 15 16 14   Creatinine 0.50 - 0.99 mg/dL 0.85 0.74 0.92  Sodium 135 - 146 mmol/L 131(L) 137 135  Potassium 3.5 - 5.3 mmol/L 4.0 4.4 4.9  Chloride 98 - 110 mmol/L 97(L) 100 97  CO2 20 - 32 mmol/L 26 28 28   Calcium 8.6 - 10.4 mg/dL 9.1 9.3 9.3  Total Protein 6.1 - 8.1 g/dL 6.2 6.5 6.5  Total Bilirubin 0.2 - 1.2 mg/dL 0.7 0.7 1.1  Alkaline Phos 39 - 117 U/L - 49 48  AST 10 - 35 U/L 24 21 28   ALT 6 - 29 U/L 12 12 15    Seen in presence of [place].    Carmell Austria, MD 06/03/2018, 2:13 PM  Cc: Raina Mina., MD

## 2018-06-03 NOTE — Patient Instructions (Signed)
If you are age 65 or older, your body mass index should be between 23-30. Your Body mass index is 21.83 kg/m. If this is out of the aforementioned range listed, please consider follow up with your Primary Care Provider.  If you are age 57 or younger, your body mass index should be between 19-25. Your Body mass index is 21.83 kg/m. If this is out of the aformentioned range listed, please consider follow up with your Primary Care Provider.   You have been scheduled for a Barium Esophogram at [facility] (1st floor of the hospital) on 06/16/2018 at 10:30am. Please arrive 15 minutes prior to your appointment for registration. Make certain not to have anything to eat or drink 6 hours prior to your test. If you need to reschedule for any reason, please contact radiology at [phone] to do so. __________________________________________________________________ A barium swallow is an examination that concentrates on views of the esophagus. This tends to be a double contrast exam (barium and two liquids which, when combined, create a gas to distend the wall of the oesophagus) or single contrast (non-ionic iodine based). The study is usually tailored to your symptoms so a good history is essential. Attention is paid during the study to the form, structure and configuration of the esophagus, looking for functional disorders (such as aspiration, dysphagia, achalasia, motility and reflux) EXAMINATION You may be asked to change into a gown, depending on the type of swallow being performed. A radiologist and radiographer will perform the procedure. The radiologist will advise you of the type of contrast selected for your procedure and direct you during the exam. You will be asked to stand, sit or lie in several different positions and to hold a small amount of fluid in your mouth before being asked to swallow while the imaging is performed .In some instances you may be asked to swallow barium coated  marshmallows to assess the motility of a solid food bolus. The exam can be recorded as a digital or video fluoroscopy procedure. POST PROCEDURE It will take 1-2 days for the barium to pass through your system. To facilitate this, it is important, unless otherwise directed, to increase your fluids for the next 24-48hrs and to resume your normal diet.  This test typically takes about 30 minutes to perform. __________________________________________________________________________________  Melanie Eaton have been scheduled for an endoscopy. Please follow written instructions given to you at your visit today. If you use inhalers (even only as needed), please bring them with you on the day of your procedure. Your physician has requested that you go to www.startemmi.com and enter the access code given to you at your visit today. This web site gives a general overview about your procedure. However, you should still follow specific instructions given to you by our office regarding your preparation for the procedure.   We have sent the following medications to your pharmacy for you to pick up at your convenience: Omeprazole 40mg  by mouth once daily.  Thank you,  Dr. Jackquline Denmark

## 2018-06-16 ENCOUNTER — Ambulatory Visit (HOSPITAL_COMMUNITY)
Admission: RE | Admit: 2018-06-16 | Discharge: 2018-06-16 | Disposition: A | Discharge status: Home / Self Care | Payer: Medicare Other | Source: Ambulatory Visit | Attending: Gastroenterology | Admitting: Gastroenterology

## 2018-06-16 DIAGNOSIS — R1310 Dysphagia, unspecified: Secondary | ICD-10-CM | POA: Diagnosis present

## 2018-06-16 DIAGNOSIS — K219 Gastro-esophageal reflux disease without esophagitis: Secondary | ICD-10-CM | POA: Diagnosis not present

## 2018-06-16 DIAGNOSIS — K222 Esophageal obstruction: Secondary | ICD-10-CM | POA: Diagnosis not present

## 2018-06-16 DIAGNOSIS — R1319 Other dysphagia: Secondary | ICD-10-CM

## 2018-06-19 ENCOUNTER — Ambulatory Visit (INDEPENDENT_AMBULATORY_CARE_PROVIDER_SITE_OTHER): Payer: Medicare Other

## 2018-06-19 ENCOUNTER — Encounter: Payer: Self-pay | Admitting: Podiatry

## 2018-06-19 ENCOUNTER — Other Ambulatory Visit: Payer: Self-pay | Admitting: Podiatry

## 2018-06-19 ENCOUNTER — Ambulatory Visit: Payer: Medicare Other | Admitting: Podiatry

## 2018-06-19 DIAGNOSIS — M779 Enthesopathy, unspecified: Secondary | ICD-10-CM

## 2018-06-19 DIAGNOSIS — M79671 Pain in right foot: Secondary | ICD-10-CM

## 2018-06-19 DIAGNOSIS — M2021 Hallux rigidus, right foot: Secondary | ICD-10-CM

## 2018-06-19 MED ORDER — TRIAMCINOLONE ACETONIDE 10 MG/ML IJ SUSP
10.0000 mg | Freq: Once | INTRAMUSCULAR | Status: AC
  Administered 2018-06-19: 10 mg

## 2018-06-20 NOTE — Progress Notes (Signed)
Subjective:   Patient ID: Blanchard Mane, female   DOB: 67 y.o.   MRN: [id number]   HPI Patient presents with several problems being warmed with acute inflammation of the lateral side of the foot with possibility for nerve irritation also along with severe hallux limitus rigidus deformity right   ROS      Objective:  Physical Exam  Neurovascular status unchanged with patient noted to have exquisite inflammation fourth MPJ right with also positive Biagio Borg sign third interspace right and severe spurring of the dorsal first metatarsal head right with lack of motion     Assessment:  Probability for capsulitis of the fourth MPJ right with possibility of neuroma symptoms and severe hallux limitus deformity right     Plan:  H&P new x-ray reviewed and today I did go ahead did a proximal block of the right forefoot and then aspirated the fourth MPJ getting out a small amount of serous fluid.  I then injected the joint quarter cc dexamethasone Kenalog and I discussed that we may when we do surgery also look for neuroma.  Discussed again surgery for hallux limitus rigidus and I do think implant will be necessary and patient wants this done but cannot do until closer to the end of the year and will let us know on timing and will be seen back prior to procedure  X-ray indicates severe spurring first metatarsal head right dorsal with compression of the joint and significant arthritis with no indication of stress fracture arthritis on the lateral side of the foot

## 2018-06-23 ENCOUNTER — Encounter: Payer: Self-pay | Admitting: Gastroenterology

## 2018-06-23 ENCOUNTER — Ambulatory Visit (AMBULATORY_SURGERY_CENTER): Payer: Medicare Other | Admitting: Gastroenterology

## 2018-06-23 VITALS — BP 165/87 | HR 59 | Temp 98.7°F | Resp 12 | Ht 63.0 in | Wt 123.0 lb

## 2018-06-23 DIAGNOSIS — R1310 Dysphagia, unspecified: Secondary | ICD-10-CM | POA: Diagnosis not present

## 2018-06-23 DIAGNOSIS — K222 Esophageal obstruction: Secondary | ICD-10-CM | POA: Diagnosis not present

## 2018-06-23 DIAGNOSIS — K2970 Gastritis, unspecified, without bleeding: Secondary | ICD-10-CM

## 2018-06-23 MED ORDER — SODIUM CHLORIDE 0.9 % IV SOLN: 500.0000 mL | Freq: Once | INTRAVENOUS | Status: DC

## 2018-06-23 NOTE — Op Note (Signed)
[place] Patient Name: Melanie Eaton Procedure Date: 06/23/2018 2:24 PM MRN: [id number] Endoscopist: Jackquline Denmark , MD Age: 67 Referring MD:  Date of Birth: December 07, 1950 Gender: Female Account #: [id number] Procedure:                Upper GI endoscopy Indications:              Dysphagia with abnormal barium swallow showing hang                            up of Ba tablet in the distal esophagus. Medicines:                Monitored Anesthesia Care Procedure:                Pre-Anesthesia Assessment:                           - Prior to the procedure, a History and Physical                            was performed, and patient medications and                            allergies were reviewed. The patient's tolerance of                            previous anesthesia was also reviewed. The risks                            and benefits of the procedure and the sedation                            options and risks were discussed with the patient.                            All questions were answered, and informed consent                            was obtained. Prior Anticoagulants: The patient has                            taken no previous anticoagulant or antiplatelet                            agents. ASA Grade Assessment: II - A patient with                            mild systemic disease. After reviewing the risks                            and benefits, the patient was deemed in                            satisfactory condition to undergo the procedure.  After obtaining informed consent, the endoscope was                            passed under direct vision. Throughout the                            procedure, the patient's blood pressure, pulse, and                            oxygen saturations were monitored continuously. The                            Model GIF-HQ190 [id number]) scope was introduced                            through the  mouth, and advanced to the second part                            of duodenum. The upper GI endoscopy was                            accomplished without difficulty. The patient                            tolerated the procedure well. Scope In: Scope Out: Findings:                 One benign-appearing, intrinsic mild stenosis was                            found 36 cm from the incisors. This stenosis                            measured 1.2 cm (inner diameter). The stenosis was                            traversed. The scope was withdrawn. Dilation was                            performed with a Hurst dilator with no resistance                            at 48 Fr. Few linear and circular furrows of ?                            importance. Biopsies were obtained from the                            proximal and distal esophagus with cold forceps for                            histology of suspected eosinophilic esophagitis.  Localized mild inflammation characterized by                            erythema was found in the gastric antrum. Biopsies                            were taken with a cold forceps for histology.                           The exam was otherwise without abnormality. Complications:            No immediate complications. Estimated Blood Loss:     Estimated blood loss: none. Impression:               - Benign-appearing esophageal stenosis. Dilated.                            Biopsied.                           - Gastritis. Biopsied.                           - The examination was otherwise normal. Recommendation:           - Patient has a contact number available for                            emergencies. The signs and symptoms of potential                            delayed complications were discussed with the                            patient. Return to normal activities tomorrow.                            Written discharge instructions were  provided to the                            patient.                           - Post-dilatation diet. Resume previous diet.                           - Continue present medications. Can reduce                            omeprazole to 20mg  po qd.                           - Await pathology results.                           - Return to GI clinic PRN. Jackquline Denmark, MD 06/23/2018 2:42:06 PM This report has been signed electronically.

## 2018-06-23 NOTE — Progress Notes (Signed)
Report to PACU, RN, vss, BBS= Clear.  

## 2018-06-23 NOTE — Progress Notes (Signed)
Called to room to assist during endoscopic procedure.  Patient ID and intended procedure confirmed with present staff. Received instructions for my participation in the procedure from the performing physician.  

## 2018-06-23 NOTE — Patient Instructions (Signed)
Post dilation diet given  YOU HAD AN ENDOSCOPIC PROCEDURE TODAY AT [facility]:   Refer to the procedure report that was given to you for any specific questions about what was found during the examination.  If the procedure report does not answer your questions, please call your gastroenterologist to clarify.  If you requested that your care partner not be given the details of your procedure findings, then the procedure report has been included in a sealed envelope for you to review at your convenience later.  YOU SHOULD EXPECT: Some feelings of bloating in the abdomen. Passage of more gas than usual.  Walking can help get rid of the air that was put into your GI tract during the procedure and reduce the bloating. If you had a lower endoscopy (such as a colonoscopy or flexible sigmoidoscopy) you may notice spotting of blood in your stool or on the toilet paper. If you underwent a bowel prep for your procedure, you may not have a normal bowel movement for a few days.  Please Note:  You might notice some irritation and congestion in your nose or some drainage.  This is from the oxygen used during your procedure.  There is no need for concern and it should clear up in a day or so.  SYMPTOMS TO REPORT IMMEDIATELY:     Following upper endoscopy (EGD)  Vomiting of blood or coffee ground material  New chest pain or pain under the shoulder blades  Painful or persistently difficult swallowing  New shortness of breath  Fever of 100F or higher  Black, tarry-looking stools  For urgent or emergent issues, a gastroenterologist can be reached at any hour by calling [phone].   DIET:  We do recommend a small meal at first, but then you may proceed to your regular diet.  Drink plenty of fluids but you should avoid alcoholic beverages for 24 hours.  ACTIVITY:  You should plan to take it easy for the rest of today and you should NOT DRIVE or use heavy machinery until tomorrow (because  of the sedation medicines used during the test).    FOLLOW UP: Our staff will call the number listed on your records the next business day following your procedure to check on you and address any questions or concerns that you may have regarding the information given to you following your procedure. If we do not reach you, we will leave a message.  However, if you are feeling well and you are not experiencing any problems, there is no need to return our call.  We will assume that you have returned to your regular daily activities without incident.  If any biopsies were taken you will be contacted by phone or by letter within the next 1-3 weeks.  Please call us at [phone] if you have not heard about the biopsies in 3 weeks.    SIGNATURES/CONFIDENTIALITY: You and/or your care partner have signed paperwork which will be entered into your electronic medical record.  These signatures attest to the fact that that the information above on your After Visit Summary has been reviewed and is understood.  Full responsibility of the confidentiality of this discharge information lies with you and/or your care-partner.

## 2018-06-24 ENCOUNTER — Telehealth: Payer: Self-pay

## 2018-06-24 NOTE — Telephone Encounter (Signed)
  Follow up Call-  Call back number 06/23/2018  Post procedure Call Back phone  # [phone]  Permission to leave phone message Yes  Some recent data might be hidden     Patient questions:  Do you have a fever, pain , or abdominal swelling? No. Pain Score  0 *  Have you tolerated food without any problems? Yes.    Have you been able to return to your normal activities? Yes.    Do you have any questions about your discharge instructions: Diet   No. Medications  No. Follow up visit  No.  Do you have questions or concerns about your Care? No.  Actions: * If pain score is 4 or above: No action needed, pain <4.

## 2018-06-28 ENCOUNTER — Encounter: Payer: Self-pay | Admitting: Gastroenterology

## 2018-07-02 ENCOUNTER — Telehealth: Payer: Self-pay | Admitting: *Deleted

## 2018-07-02 NOTE — Telephone Encounter (Addendum)
"  I see Dr. Paulla Dolly.  He gave me your number to call.  I been seeing him for about four years.  It's about time for me to have bunion surgery.  If you could, give me a call back."  I attempted to call the patient.  I left her a message to call me back.  "I am sorry I missed your call.  How long can I wait to get my foot surgery scheduled?"  You need to do it as soon as possible because in our Fall/Winter months, his schedule gets full quickly.  "So he won't have anything for October?"  He does right now but if you wait a while, he may not.  "I'm driving right now.  I'll call you tomorrow and schedule it."

## 2018-07-16 NOTE — Telephone Encounter (Signed)
"  I need to have bunion surgery.  I wanted to talk to you about some possible dates.  You told me to go ahead and get on the calendar.  I talked to you a week ago and you told me to go ahead and get on the calendar because it's very busy towards the end of the year."

## 2018-07-17 NOTE — Telephone Encounter (Signed)
"  I'm calling to schedule my surgery with Dr. Paulla Dolly."  Do you have a date in mind?  He does surgery on Tuesdays.  "Does he have anything on November 5?"  Yes, he can do it then.  "How about October 29?"  Yes, that date is available as well.  "Let's go with October 29.  What time will I need to be there?"  Someone from the surgical center will give you a call the Friday or Monday before your appointment.  They will give you your arrival time.

## 2018-07-18 ENCOUNTER — Telehealth: Payer: Self-pay | Admitting: *Deleted

## 2018-07-18 NOTE — Telephone Encounter (Signed)
I am calling to let you know that you need to see Dr. Paulla Dolly for a consult prior to your surgery date, so you can sign your consent forms.  "I was getting ready to call you.  I need to reschedule my surgery to November 5.  My husband reminded me that we have an engagement to attend at the end of October.  I had forgot all about it."  I will change the date.  Would you like me to transfer you to a scheduler so you can make an appointment?  "Sure, that would be great."  I transferred her to [employer]

## 2018-07-21 ENCOUNTER — Telehealth: Payer: Self-pay | Admitting: Podiatry

## 2018-07-21 MED ORDER — DICLOFENAC SODIUM 75 MG PO TBEC: 75.0000 mg | DELAYED_RELEASE_TABLET | Freq: Two times a day (BID) | ORAL | 0 refills | Status: DC

## 2018-07-21 NOTE — Telephone Encounter (Signed)
Left message informing pt of Dr. Leeanne Rio orders for diclofenac.

## 2018-07-21 NOTE — Telephone Encounter (Signed)
We can Rx Diclofenac 75mg   Bid x 30 tabs with no refills. Be sure patient knows to refrain from taking any other OTC NSAIDs while on this medication, to take with food to protect her stomach, if she has any history of hypertension to monitor blood pressure while on anti-inflammatories and to call office or go to urgent care/ER if she experiences any adverse reactions.  -Dr Chauncey Cruel

## 2018-07-21 NOTE — Telephone Encounter (Signed)
I'm scheduled to have surgery with Dr. Paulla Dolly on 05 November. I was in to see him on 15 August and got a cortisone shot. I was wanting to know if you all could call me in some kind of antiinflammatory medication because my foot is hurting. Just call me back. Thanks.

## 2018-07-21 NOTE — Addendum Note (Signed)
Addended by: Harriett Sine D on: 07/21/2018 02:30 PM   Modules accepted: Orders

## 2018-07-21 NOTE — Telephone Encounter (Signed)
I called you about an hour ago and I don't think I left my telephone number where you can reach me at. My number is [phone].

## 2018-07-23 NOTE — Telephone Encounter (Signed)
I told pt Dr. Cannon Kettle had ordered an antiinflammatory diclofenac, and she should protect her stomach. Pt asked if she should take with prilosec and I told her that and eat before the diclofenac.

## 2018-07-28 ENCOUNTER — Encounter: Payer: Medicare Other | Admitting: Gastroenterology

## 2018-08-11 ENCOUNTER — Ambulatory Visit: Payer: Medicare Other | Admitting: Podiatry

## 2018-08-11 ENCOUNTER — Encounter: Payer: Self-pay | Admitting: Podiatry

## 2018-08-11 DIAGNOSIS — M779 Enthesopathy, unspecified: Secondary | ICD-10-CM

## 2018-08-11 DIAGNOSIS — D3610 Benign neoplasm of peripheral nerves and autonomic nervous system, unspecified: Secondary | ICD-10-CM | POA: Diagnosis not present

## 2018-08-11 DIAGNOSIS — M2021 Hallux rigidus, right foot: Secondary | ICD-10-CM

## 2018-08-11 NOTE — Patient Instructions (Signed)
Pre-Operative Instructions  Congratulations, you have decided to take an important step towards improving your quality of life.  You can be assured that the doctors and staff at [facility] will be with you every step of the way.  Here are some important things you should know:  1. Plan to be at the surgery center/hospital at least 1 (one) hour prior to your scheduled time, unless otherwise directed by the surgical center/hospital staff.  You must have a responsible adult accompany you, remain during the surgery and drive you home.  Make sure you have directions to the surgical center/hospital to ensure you arrive on time. 2. If you are having surgery at [facility] or [facility], you will need a copy of your medical history and physical form from your family physician within one month prior to the date of surgery. We will give you a form for your primary physician to complete.  3. We make every effort to accommodate the date you request for surgery.  However, there are times where surgery dates or times have to be moved.  We will contact you as soon as possible if a change in schedule is required.   4. No aspirin/ibuprofen for one week before surgery.  If you are on aspirin, any non-steroidal anti-inflammatory medications (Mobic, Aleve, Ibuprofen) should not be taken seven (7) days prior to your surgery.  You make take Tylenol for pain prior to surgery.  5. Medications - If you are taking daily heart and blood pressure medications, seizure, reflux, allergy, asthma, anxiety, pain or diabetes medications, make sure you notify the surgery center/hospital before the day of surgery so they can tell you which medications you should take or avoid the day of surgery. 6. No food or drink after midnight the night before surgery unless directed otherwise by surgical center/hospital staff. 7. No alcoholic beverages 24-hours prior to surgery.  No smoking 24-hours prior or 24-hours after  surgery. 8. Wear loose pants or shorts. They should be loose enough to fit over bandages, boots, and casts. 9. Don't wear slip-on shoes. Sneakers are preferred. 10. Bring your boot with you to the surgery center/hospital.  Also bring crutches or a walker if your physician has prescribed it for you.  If you do not have this equipment, it will be provided for you after surgery. 11. If you have not been contacted by the surgery center/hospital by the day before your surgery, call to confirm the date and time of your surgery. 12. Leave-time from work may vary depending on the type of surgery you have.  Appropriate arrangements should be made prior to surgery with your employer. 13. Prescriptions will be provided immediately following surgery by your doctor.  Fill these as soon as possible after surgery and take the medication as directed. Pain medications will not be refilled on weekends and must be approved by the doctor. 14. Remove nail polish on the operative foot and avoid getting pedicures prior to surgery. 15. Wash the night before surgery.  The night before surgery wash the foot and leg well with water and the antibacterial soap provided. Be sure to pay special attention to beneath the toenails and in between the toes.  Wash for at least three (3) minutes. Rinse thoroughly with water and dry well with a towel.  Perform this wash unless told not to do so by your physician.  Enclosed: 1 Ice pack (please put in freezer the night before surgery)   1 Hibiclens skin cleaner     Pre-op instructions  If you have any questions regarding the instructions, please do not hesitate to call our office.  [place]: [address], [place], [place] [address] -- [phone]  [place]: [address] [place], [place] [address] -- [phone]  [place]: [address]  [place], [place] [address] -- [phone]  [place]: [address], [address], [place],  [address] -- [phone]  Website:  [email or web address] 

## 2018-08-12 NOTE — Progress Notes (Signed)
Subjective:   Patient ID: Melanie Eaton, female   DOB: 67 y.o.   MRN: [id number]   HPI Patient presents with severe spurring arthritis and bone spur formation around the big toe joint right along with chronic pain of the fourth metatarsal joint right and shooting pains in the interspace between the third and fourth toes right foot.  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  Condition reviewed and discussed at great length.  I explained the difficulty of her condition but I cannot give complete answers to everything but I do think it will require removal of all the bone spurs first MPJ with joint implantation procedure along with shortening osteotomy fourth right and possible neuroma excision third right.  I spent a great deal time going over there is no guarantee that this will solve her problems and all complications and alternative treatments as outlined in the consent form reviewed.  Patient wants surgery and understands total recovery can take 6 months to 1 year and at this time I did dispense air fracture walker for the postoperative.  With instructions on usage.  Patient is encouraged to call with any questions

## 2018-08-13 ENCOUNTER — Other Ambulatory Visit: Payer: Self-pay | Admitting: Podiatry

## 2018-08-13 MED ORDER — DICLOFENAC SODIUM 75 MG PO TBEC: 75.0000 mg | DELAYED_RELEASE_TABLET | Freq: Two times a day (BID) | ORAL | 0 refills | Status: DC

## 2018-09-09 ENCOUNTER — Encounter: Payer: Self-pay | Admitting: Podiatry

## 2018-09-09 DIAGNOSIS — M21541 Acquired clubfoot, right foot: Secondary | ICD-10-CM

## 2018-09-09 DIAGNOSIS — G5761 Lesion of plantar nerve, right lower limb: Secondary | ICD-10-CM

## 2018-09-09 DIAGNOSIS — M2021 Hallux rigidus, right foot: Secondary | ICD-10-CM | POA: Diagnosis not present

## 2018-09-11 ENCOUNTER — Encounter: Payer: Self-pay | Admitting: Podiatry

## 2018-09-11 ENCOUNTER — Telehealth: Payer: Self-pay | Admitting: Podiatry

## 2018-09-11 MED ORDER — HYDROCODONE-ACETAMINOPHEN 10-325 MG PO TABS: 1.0000 | ORAL_TABLET | ORAL | 0 refills | Status: DC | PRN

## 2018-09-11 NOTE — Telephone Encounter (Signed)
I spoke with pt and pharmacist told them to get sarna lotion for the itch. I told pt to use benadryl for he itch also. I told pt to stop the percocet and she could use the OTC antiinflammatory that she tolerated in between the doses of the new pain medication I would ask Dr. Paulla Dolly to prescribe.

## 2018-09-11 NOTE — Telephone Encounter (Signed)
Pt states she has been able to take hydrocodone before without problems.

## 2018-09-11 NOTE — Telephone Encounter (Signed)
I informed pt the hydrocodone had been sent to the [facility], that if she should experience any swelling of the face or mouth, difficulty breathing she should go to the ED.

## 2018-09-11 NOTE — Telephone Encounter (Signed)
I had surgery on Tuesday with Dr. Paulla Dolly. I was prescribed percocet for pain. I'm itching like crazy. If you can call me back at [phone].

## 2018-09-11 NOTE — Addendum Note (Signed)
Addended by: Hardie Pulley on: 09/11/2018 02:15 PM   Modules accepted: Orders

## 2018-09-15 ENCOUNTER — Ambulatory Visit (INDEPENDENT_AMBULATORY_CARE_PROVIDER_SITE_OTHER): Payer: Medicare Other | Admitting: Podiatry

## 2018-09-15 ENCOUNTER — Encounter: Payer: Self-pay | Admitting: Podiatry

## 2018-09-15 ENCOUNTER — Ambulatory Visit (INDEPENDENT_AMBULATORY_CARE_PROVIDER_SITE_OTHER): Payer: Medicare Other

## 2018-09-15 VITALS — Temp 97.7°F

## 2018-09-15 DIAGNOSIS — D3610 Benign neoplasm of peripheral nerves and autonomic nervous system, unspecified: Secondary | ICD-10-CM

## 2018-09-15 DIAGNOSIS — M2021 Hallux rigidus, right foot: Secondary | ICD-10-CM | POA: Diagnosis not present

## 2018-09-15 MED ORDER — HYDROCODONE-ACETAMINOPHEN 10-325 MG PO TABS: 1.0000 | ORAL_TABLET | ORAL | 0 refills | Status: DC | PRN

## 2018-09-17 NOTE — Progress Notes (Signed)
Subjective:   Patient ID: Melanie Eaton, female   DOB: 67 y.o.   MRN: [id number]   HPI Patient states doing very well with surgery with minimal discomfort or swelling   ROS      Objective:  Physical Exam  Neurovascular status intact with patient's right foot healing well with wound edges well coapted hallux in rectus position with excellent motion and diminishment of discomfort around the fourth MPJ.  Negative Homans sign was noted     Assessment:  Patient is doing well post implant surgery right with metatarsal osteotomy right     Plan:  H&P condition reviewed and x-ray reviewed with patient.  I reapplied sterile dressing instructed on continued elevation compression immobilization and will begin physical therapy to mobilize the joint and reduce swelling.  Reappoint in 2 to 3 weeks or earlier if needed  X-ray indicates that the implant is in good position and the osteotomy looks good with screw fixation adequate

## 2018-09-24 ENCOUNTER — Telehealth: Payer: Self-pay | Admitting: Podiatry

## 2018-09-24 NOTE — Telephone Encounter (Signed)
Pt recently had bunion surgery and is still experiencing pain. Pt would like to know if she can get a refill on her pain medication.

## 2018-09-25 ENCOUNTER — Other Ambulatory Visit: Payer: Self-pay | Admitting: Podiatry

## 2018-09-25 MED ORDER — HYDROCODONE-ACETAMINOPHEN 10-325 MG PO TABS: 1.0000 | ORAL_TABLET | ORAL | 0 refills | Status: DC | PRN

## 2018-09-25 NOTE — Progress Notes (Signed)
Refilled pain medication at the request of Dr. Paulla Dolly as he cannot send electronically.

## 2018-09-25 NOTE — Telephone Encounter (Signed)
Left message informing pt, I would inform Dr. Paulla Dolly and he would send the pain medication to the [facility].

## 2018-09-29 NOTE — Telephone Encounter (Signed)
That's fine but I don't know how to send in

## 2018-09-30 ENCOUNTER — Ambulatory Visit (INDEPENDENT_AMBULATORY_CARE_PROVIDER_SITE_OTHER): Payer: Medicare Other | Admitting: Sports Medicine

## 2018-09-30 ENCOUNTER — Encounter: Payer: Self-pay | Admitting: Sports Medicine

## 2018-09-30 DIAGNOSIS — D3610 Benign neoplasm of peripheral nerves and autonomic nervous system, unspecified: Secondary | ICD-10-CM

## 2018-09-30 DIAGNOSIS — M21619 Bunion of unspecified foot: Secondary | ICD-10-CM

## 2018-09-30 DIAGNOSIS — M2021 Hallux rigidus, right foot: Secondary | ICD-10-CM | POA: Diagnosis not present

## 2018-09-30 NOTE — Progress Notes (Signed)
Subjective: Melanie Eaton is a 67 y.o. female patient seen today in office for post op care, patient is s/p R keller bunionectomy with implant and third webspace neurectomy with Dr. Paulla Dolly on 09-09-18. Patient admits to a little pain that has gotten a lot better with some tingling on the bottom of the foot, denies calf pain, denies headache, chest pain, shortness of breath, nausea, vomiting, fever, or chills. No other issues noted.   Patient Active Problem List   Diagnosis Date Noted  . Encounter for hepatitis C screening test for low risk patient 11/18/2017  . Essential tremor 08/16/2016  . 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 Age-related osteoporosis without current pathological fracture 08/15/2016  . DDD (degenerative disc disease), lumbosacral 08/15/2016  . Malaise and fatigue 08/15/2016  . Hypercholesteremia   . Hypertension   . Depression   . Osteoporosis 08/05/2010    Current Outpatient Medications on File Prior to Visit  Medication Sig Dispense Refill  . atenolol (TENORMIN) 25 MG tablet Take 25 mg by mouth daily.    Marland Kitchen buPROPion (WELLBUTRIN XL) 300 MG 24 hr tablet Take 300 mg by mouth daily.    . Calcium Carbonate-Vitamin D (CALCIUM + D PO) Take by mouth.    . diclofenac (VOLTAREN) 75 MG EC tablet Take 1 tablet (75 mg total) by mouth 2 (two) times daily. 30 tablet 0  . HYDROcodone-acetaminophen (NORCO) 10-325 MG tablet Take 1 tablet by mouth every 4 (four) hours as needed. 15 tablet 0  . ibuprofen (ADVIL,MOTRIN) 200 MG tablet Take 200 mg by mouth every 6 (six) hours as needed for headache.    . Multiple Vitamin (MULTIVITAMIN) tablet Take 1 tablet by mouth daily.    Marland Kitchen omeprazole (PRILOSEC) 40 MG capsule Take 1 capsule (40 mg total) by mouth daily. 90 capsule 3  . simvastatin (ZOCOR) 20 MG tablet Take 20 mg by mouth at bedtime.      . traZODone (DESYREL) 50 MG tablet Take 50 mg by mouth at bedtime.     Current Facility-Administered Medications on File  Prior to Visit  Medication Dose Route Frequency Provider Last Rate Last Dose  . 0.9 %  sodium chloride infusion  500 mL Intravenous Once Jackquline Denmark, MD        Allergies  Allergen Reactions  . Percocet [Oxycodone-Acetaminophen] Itching  . Penicillins Rash    Objective: There were no vitals filed for this visit.  General: No acute distress, AAOx3  Right foot: Sutures intact with no gapping or dehiscence at surgical site, mild swelling to right forefoot, no erythema, no warmth, no drainage, no signs of infection noted, Capillary fill time <3 seconds in all digits, gross sensation present via light touch to right foot with subjective numbness and tingling to toes and first metatarsal phalangeal joint.  Mild guarding with range of motion of right foot.  No pain with calf compression.   Assessment and Plan:  Problem List Items Addressed This Visit    None    Visit Diagnoses    Hallux rigidus, right foot    -  Primary   Neuroma       Bunion           -Patient seen and evaluated -Sutures removed  -Advised patient antibiotic cream to incision sites as needed -May shower and re-apply compression anklet as dispensed this visit for edema control -Advised patient to continue with post-op shoe on right foot -Advised patient to limit activity to necessity  -Advised patient  to ice and elevate as necessary  -Patient to return to the office for follow-up with Dr. Paulla Dolly as scheduled. In the meantime, patient to call office if any issues or problems arise.   Landis Martins, DPM

## 2018-10-15 ENCOUNTER — Ambulatory Visit (INDEPENDENT_AMBULATORY_CARE_PROVIDER_SITE_OTHER): Payer: Self-pay | Admitting: Podiatry

## 2018-10-15 ENCOUNTER — Encounter: Payer: Self-pay | Admitting: Podiatry

## 2018-10-15 ENCOUNTER — Ambulatory Visit (INDEPENDENT_AMBULATORY_CARE_PROVIDER_SITE_OTHER): Payer: Medicare Other

## 2018-10-15 DIAGNOSIS — M2021 Hallux rigidus, right foot: Secondary | ICD-10-CM | POA: Diagnosis not present

## 2018-10-15 MED ORDER — HYDROCODONE-ACETAMINOPHEN 10-325 MG PO TABS: 1.0000 | ORAL_TABLET | Freq: Three times a day (TID) | ORAL | 0 refills | Status: DC | PRN

## 2018-10-15 MED ORDER — DICLOFENAC SODIUM 75 MG PO TBEC: 75.0000 mg | DELAYED_RELEASE_TABLET | Freq: Two times a day (BID) | ORAL | 2 refills | Status: DC

## 2018-10-15 NOTE — Progress Notes (Signed)
Subjective:   Patient ID: Melanie Eaton, female   DOB: 67 y.o.   MRN: [id number]   HPI Patient presents stating overall doing well with mild discomfort but admits she has not been walking on the inside of her foot and she still has swelling   ROS      Objective:  Physical Exam neurovascular status intact negative Homans sign noted with wound edges well coapted and definite restriction of motion first MPJ right over what I like to see with moderate increase in the edema noted     Assessment:  Excellent appearance of the site overall but concerned because there is some restriction of motion first MPJ     Plan:  Reviewed x-rays on at this point explained the importance of exercise in the joint continue physical therapy and walk on that side of her foot and encouraged her to start wearing tennis shoes.  Wrote prescription for diclofenac 75 mg twice daily and also continued hydrocodone usage on occasional basis  X-rays indicate that the implant is in excellent position and the screw fourth metatarsal looks good with no indication of secondary motion

## 2018-11-19 ENCOUNTER — Encounter: Payer: Self-pay | Admitting: Podiatry

## 2018-11-19 ENCOUNTER — Ambulatory Visit (INDEPENDENT_AMBULATORY_CARE_PROVIDER_SITE_OTHER): Payer: Medicare Other | Admitting: Podiatry

## 2018-11-19 ENCOUNTER — Ambulatory Visit (INDEPENDENT_AMBULATORY_CARE_PROVIDER_SITE_OTHER): Payer: Medicare Other

## 2018-11-19 DIAGNOSIS — M2021 Hallux rigidus, right foot: Secondary | ICD-10-CM

## 2018-11-20 NOTE — Progress Notes (Signed)
Subjective:   Patient ID: Melanie Eaton, female   DOB: 68 y.o.   MRN: [id number]   HPI Patient presents stating she is feeling a lot better but she is working on the range of motion of the big toe joint with physical therapy   ROS      Objective:  Physical Exam  Neurovascular status intact with patient's right first MPJ showing minimal discomfort with palpation with reduced range of motion but no pain.  The fourth metatarsal appears to be healing well with no pain and there is no pain in the third interspace     Assessment:  Patient is making very good progress after having implant arthroplasty and also osteotomy with reduced range of motion first MPJ     Plan:  We discussed that she had no motion preoperatively and that even what she has now is better than what she had but I would like to have it as good as possible so I have strongly recommended the continuation of physical therapy and for her to continue to walk on her whole foot.  Patient's x-rays were reviewed today and she will be seen back on an as-needed basis  X-ray indicates that the implant is in excellent position and the osteotomy is healed well with screw in place no indications of movement currently

## 2019-01-22 DIAGNOSIS — F419 Anxiety disorder, unspecified: Secondary | ICD-10-CM | POA: Insufficient documentation

## 2019-04-01 ENCOUNTER — Other Ambulatory Visit: Payer: Self-pay

## 2019-04-02 ENCOUNTER — Encounter: Payer: Self-pay | Admitting: Gynecology

## 2019-04-02 ENCOUNTER — Ambulatory Visit: Payer: Medicare Other | Admitting: Gynecology

## 2019-04-02 VITALS — BP 122/78 | Ht 62.0 in | Wt 126.0 lb

## 2019-04-02 DIAGNOSIS — Z01419 Encounter for gynecological examination (general) (routine) without abnormal findings: Secondary | ICD-10-CM | POA: Diagnosis not present

## 2019-04-02 DIAGNOSIS — N952 Postmenopausal atrophic vaginitis: Secondary | ICD-10-CM | POA: Diagnosis not present

## 2019-04-02 DIAGNOSIS — M810 Age-related osteoporosis without current pathological fracture: Secondary | ICD-10-CM | POA: Diagnosis not present

## 2019-04-02 MED ORDER — RISEDRONATE SODIUM 150 MG PO TABS: 150.0000 mg | ORAL_TABLET | ORAL | 4 refills | Status: DC

## 2019-04-02 NOTE — Progress Notes (Signed)
    Melanie Eaton February 25, 1951 [id number]        68 y.o.  [id number] for annual gynecologic exam.  Without gynecologic complaints  Past medical history,surgical history, problem list, medications, allergies, family history and social history were all reviewed and documented as reviewed in the EPIC chart.  ROS:  Performed with pertinent positives and negatives included in the history, assessment and plan.   Additional significant findings : None   Exam: [name] assistant Vitals:   04/02/19 0800  BP: 122/78  Weight: 126 lb (57.2 kg)  Height: 5\' 2"  (1.575 m)   Body mass index is 23.05 kg/m.  General appearance:  Normal affect, orientation and appearance. Skin: Grossly normal HEENT: Without gross lesions.  No cervical or supraclavicular adenopathy. Thyroid normal.  Lungs:  Clear without wheezing, rales or rhonchi Cardiac: RR, without RMG Abdominal:  Soft, nontender, without masses, guarding, rebound, organomegaly or hernia Breasts:  Examined lying and sitting without masses, retractions, discharge or axillary adenopathy. Pelvic:  Ext, BUS, Vagina: With atrophic changes  Cervix: With atrophic changes  Uterus: Anteverted, normal size, shape and contour, midline and mobile nontender   Adnexa: Without masses or tenderness    Anus and perineum: Normal   Rectovaginal: Normal sphincter tone without palpated masses or tenderness.    Assessment/Plan:  68 y.o. [id number] female for annual gynecologic exam.   1. Postmenopausal.  No significant menopausal symptoms or any vaginal bleeding. 2. Osteoporosis.  DEXA 10/2017 T score -3 7% loss right femoral neck noted from prior study 2015.  Had been on Actonel 150 mg monthly but ran out.  Refill x1 year provided.  Will plan follow-up DEXA next year.  Increase calcium vitamin D. 3. Mammography in the process of being scheduled.  Breast exam normal today. 4. Pap smear 10/2017.  No Pap smear done today.  No history of abnormal Pap smears. 5.  Colonoscopy 2016.  Repeat at their recommended interval. 6. Health maintenance.  No routine lab work done as patient does this through her primary provider.  Follow-up 1 year, sooner as needed.   [name] MD, 8:34 AM 04/02/2019

## 2019-04-02 NOTE — Patient Instructions (Signed)
Follow-up in 1 year for annual exam, sooner if any issues. 

## 2019-08-12 ENCOUNTER — Encounter: Payer: Self-pay | Admitting: Gynecology

## 2019-12-04 ENCOUNTER — Telehealth: Payer: Self-pay | Admitting: *Deleted

## 2019-12-04 NOTE — Telephone Encounter (Signed)
Patient called requesting order fax to [facility] for mammogram screening and bone density. Last dexa and mammogram in 2018. Order placed on Dr.Kendall desk to schedule.

## 2019-12-09 ENCOUNTER — Encounter: Payer: Self-pay | Admitting: Gynecology

## 2020-01-07 ENCOUNTER — Encounter: Payer: Self-pay | Admitting: *Deleted

## 2020-01-21 NOTE — Telephone Encounter (Signed)
Patient has another doctor that is handling her bones and patient is expecting to get the prolia injection.

## 2020-05-12 ENCOUNTER — Other Ambulatory Visit: Payer: Self-pay | Admitting: Podiatry

## 2020-05-12 ENCOUNTER — Ambulatory Visit: Payer: Medicare PPO | Admitting: Podiatry

## 2020-05-12 ENCOUNTER — Encounter: Payer: Self-pay | Admitting: Podiatry

## 2020-05-12 ENCOUNTER — Ambulatory Visit (INDEPENDENT_AMBULATORY_CARE_PROVIDER_SITE_OTHER): Payer: Medicare PPO

## 2020-05-12 ENCOUNTER — Other Ambulatory Visit: Payer: Self-pay

## 2020-05-12 VITALS — Temp 97.2°F

## 2020-05-12 DIAGNOSIS — M779 Enthesopathy, unspecified: Secondary | ICD-10-CM

## 2020-05-12 DIAGNOSIS — M79675 Pain in left toe(s): Secondary | ICD-10-CM

## 2020-05-13 NOTE — Progress Notes (Signed)
Subjective:   Patient ID: Melanie Eaton, female   DOB: 69 y.o.   MRN: [id number]   HPI Patient presents stating that she has started to develop pain in her third toe joint and the surgery we did she is very pleased with this is been more recent   ROS      Objective:  Physical Exam  Neurovascular status intact with patient found to have inflammation around the third metatarsal phalangeal joint right that is painful when pressed with mild to moderate fluid accumulation and has had a fourth metatarsal osteotomy along with hallux implant procedure     Assessment:  Probability for capsulitis of the third MPJ right     Plan:  Reviewed condition sterile prep done and I went ahead and I did an anesthetic block of the right forefoot.  I then aspirated the third MPJ getting out a small amount of clear fluid and injected with quarter cc dexamethasone Kenalog and applied thick padding to take pressure off the joint surface.  Reappoint to recheck  X-ray indicates that the implant is in place and the screw is in place no indications of pathology of stress fracture or arthritis around the third metatarsal phalangeal joint

## 2020-07-27 ENCOUNTER — Telehealth: Payer: Self-pay | Admitting: Podiatry

## 2020-07-27 NOTE — Telephone Encounter (Signed)
Patient calling requesting a prednisone pack. She is still having trouble with her foot. Been using ice and OTC NSAID but not helping.

## 2020-07-27 NOTE — Telephone Encounter (Signed)
Pt was given injection several wks ago-states foot is still hurting and wanted to know if a round of prednisone would help. [place].

## 2020-07-28 MED ORDER — METHYLPREDNISOLONE 4 MG PO TBPK
ORAL_TABLET | ORAL | 0 refills | Status: DC
Start: 2020-07-28 — End: 2024-01-20

## 2020-07-28 NOTE — Telephone Encounter (Signed)
Left message - informed her prednisone pack was sent in and to discontinue the use of OTC NSAIDS, but continue using the ice. If no improvement, call and schedule an appt for follow up with Dr. Paulla Dolly.

## 2020-07-28 NOTE — Addendum Note (Signed)
Addended by: Hardie Pulley on: 07/28/2020 12:40 PM   Modules accepted: Orders

## 2022-04-06 ENCOUNTER — Other Ambulatory Visit: Payer: Self-pay | Admitting: Orthopedic Surgery

## 2022-04-06 DIAGNOSIS — M25561 Pain in right knee: Secondary | ICD-10-CM

## 2022-04-08 ENCOUNTER — Ambulatory Visit
Admission: RE | Admit: 2022-04-08 | Discharge: 2022-04-08 | Disposition: A | Discharge status: Home / Self Care | Payer: Medicare PPO | Source: Ambulatory Visit | Attending: Orthopedic Surgery | Admitting: Orthopedic Surgery

## 2022-04-08 DIAGNOSIS — M25561 Pain in right knee: Secondary | ICD-10-CM

## 2023-12-19 ENCOUNTER — Other Ambulatory Visit (HOSPITAL_BASED_OUTPATIENT_CLINIC_OR_DEPARTMENT_OTHER): Payer: Self-pay | Admitting: Specialist

## 2023-12-19 DIAGNOSIS — Z1231 Encounter for screening mammogram for malignant neoplasm of breast: Secondary | ICD-10-CM

## 2023-12-19 DIAGNOSIS — M810 Age-related osteoporosis without current pathological fracture: Secondary | ICD-10-CM

## 2024-01-20 ENCOUNTER — Ambulatory Visit (INDEPENDENT_AMBULATORY_CARE_PROVIDER_SITE_OTHER)
Admit: 2024-01-20 | Discharge: 2024-01-20 | Disposition: A | Discharge status: Home / Self Care | Attending: Internal Medicine | Admitting: Internal Medicine

## 2024-01-20 ENCOUNTER — Encounter (HOSPITAL_BASED_OUTPATIENT_CLINIC_OR_DEPARTMENT_OTHER): Payer: Self-pay | Admitting: Emergency Medicine

## 2024-01-20 ENCOUNTER — Ambulatory Visit (HOSPITAL_BASED_OUTPATIENT_CLINIC_OR_DEPARTMENT_OTHER): Admission: EM | Admit: 2024-01-20 | Discharge: 2024-01-20 | Disposition: A | Discharge status: Home / Self Care

## 2024-01-20 DIAGNOSIS — S00512A Abrasion of oral cavity, initial encounter: Secondary | ICD-10-CM

## 2024-01-20 DIAGNOSIS — R040 Epistaxis: Secondary | ICD-10-CM | POA: Diagnosis not present

## 2024-01-20 DIAGNOSIS — W010XXA Fall on same level from slipping, tripping and stumbling without subsequent striking against object, initial encounter: Secondary | ICD-10-CM

## 2024-01-20 DIAGNOSIS — J3489 Other specified disorders of nose and nasal sinuses: Secondary | ICD-10-CM | POA: Diagnosis not present

## 2024-01-20 DIAGNOSIS — Y9373 Activity, racquet and hand sports: Secondary | ICD-10-CM

## 2024-01-20 MED ORDER — CEFDINIR 300 MG PO CAPS: 300.0000 mg | ORAL_CAPSULE | Freq: Two times a day (BID) | ORAL | 0 refills | Status: AC

## 2024-01-20 NOTE — Progress Notes (Signed)
 Nasal X-Ray IMPRESSION:   Suspected nondisplaced distal nasal bone fracture.  Patient updated.  No change in her plan of care, for now.

## 2024-01-20 NOTE — ED Triage Notes (Signed)
 Patient states she fell while playing tennis. [place] LOC.

## 2024-01-20 NOTE — Discharge Instructions (Addendum)
 Fall with trauma to nose.  Instructed on how to manage a nosebleed: She will hold pressure high on her nose for 5 minutes.  The location needs to be a pressure point that prevents her from inhaling through her nose.  If there is any bleeding after 5 minutes she will do the same thing for another 5 minutes.  She will repeat the pressure on her nose for 5-minute intervals until the bleeding stops.  If the bleeding persist more than 20 to 30 minutes she should call 911 and go to an emergency room.  When she has the bleeding stopped she should put an ice pack on her nose 30 minutes on 30 minutes off for several hours to help reduce the risk of future recurrence of the nosebleed.  She has contusions to her nose and face or bruising.  She should put ice packs on her face half an hour on, half an hour off for 24 to 48 hours to reduce the bruising.  After 24 to 48 hours she may use warm compresses, hot washcloths or heating pads.  Cautioned to be careful about not causing any burns.  The heat will help reduce the bruising and swelling.  The bruising will fade downhill on her face and may even fade into her cheeks jaw or chin or neck.  Again warm compresses will help reduce the bruising over time.  Use tramadol as previously provided for her shoulder, every 6 hours if needed for pain in her nose.  I will contact her once radiology has read the films but they appear negative to me.  She has an oral abrasion where her teeth cut her upper lip inside the mouth.  Provided cefdinir, 300 mg twice daily for 7 days, to use if needed for infection.  Follow-up with family doctor if symptoms do not improve, worsen or new symptoms occur.

## 2024-01-20 NOTE — ED Provider Notes (Signed)
 [facility]    CSN: [id number] Arrival date & time: 01/20/24  1029      History   Chief Complaint Chief Complaint  Patient presents with   Fall    HPI Melanie Eaton is a 73 y.o. female.   Patient states she fell while playing tennis. Denis LOC.   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    TOE SURGERY     TONSILLECTOMY      OB History     Gravida  4   Para  2   Term      Preterm      AB  2   Living  2      SAB  2   IAB      Ectopic      Multiple      Live Births               Home Medications    Prior to Admission medications   Medication Sig Start Date End Date Taking? Authorizing Provider  cefdinir (OMNICEF) 300 MG capsule Take 1 capsule (300 mg total) by mouth 2 (two) times daily after a meal for 7 days. 01/20/24 01/27/24 Yes Prescilla Sours, FNP  celecoxib (CELEBREX) 200 MG capsule Take 200 mg by mouth daily. 11/09/23  Yes [provider]  cyanocobalamin (VITAMIN B12) 1000 MCG/ML injection Inject into the muscle. 08/17/21 02/27/05 Yes [provider]  escitalopram (LEXAPRO) 10 MG  tablet Take by mouth. 10/23/21  Yes [provider]  traMADol (ULTRAM) 50 MG tablet Take 50 mg by mouth every 6 (six) hours as needed. She received #60 pills on 01/08/24 from her pharmacy   Yes [provider]  atenolol (TENORMIN) 25 MG tablet Take 25 mg by mouth daily.    [provider]  buPROPion (WELLBUTRIN XL) 300 MG 24 hr tablet Take 300 mg by mouth daily.    [provider]  Calcium Carbonate-Vitamin D (CALCIUM + D PO) Take by mouth.    [provider]  clonazePAM (KLONOPIN) 0.5 MG tablet TK 1/2 - 1 T PO QD PRA 09/13/18   [provider]  ibuprofen (ADVIL,MOTRIN) 200 MG tablet Take 200 mg by mouth every 6 (six) hours as needed for headache.    [provider]  methylphenidate (RITALIN) 20 MG tablet T AKE 1 TABLET BY MOUTH 2 TIMES DAILY 08/17/18   [provider]  Multiple Vitamin (MULTIVITAMIN) tablet Take 1 tablet by mouth daily.    [provider]  simvastatin (ZOCOR) 20 MG tablet Take 20 mg by mouth at bedtime.  [provider]  traZODone (DESYREL) 50 MG tablet Take 50 mg by mouth at bedtime.    [provider]    Family History Family History  Problem Relation Age of Onset   Hypertension Mother    Hyperlipidemia Mother    Hypertension Father    Hyperlipidemia Father    Cancer Brother 48       Parotid    Social History Social History   Tobacco Use   Smoking status: Former   Smokeless tobacco: Never   Tobacco comments:    college-Socially during college  Vaping Use   Vaping status: Never Used  Substance Use Topics   Alcohol use: Yes    Alcohol/week: 6.0 standard drinks of alcohol    Types: 6 Standard drinks or equivalent per week   Drug use: No     Allergies   Percocet [oxycodone-acetaminophen] and Penicillins   Review of Systems Review of Systems   Physical Exam Triage Vital Signs ED Triage Vitals  Encounter Vitals Group     BP 01/20/24 1040 (!) 175/95      Systolic BP Percentile --      Diastolic BP Percentile --      Pulse Rate 01/20/24 1040 64     Resp 01/20/24 1040 18     Temp 01/20/24 1040 97.6 F (36.4 C)     Temp src --      SpO2 01/20/24 1040 96 %     Weight --      Height --      Head Circumference --      Peak Flow --      Pain Score 01/20/24 1037 5     Pain Loc --      Pain Education --      Exclude from Growth Chart --    No data found.  Updated Vital Signs BP (!) 175/95 (BP Location: Right Arm)   Pulse 64   Temp 97.6 F (36.4 C)   Resp 18   SpO2 96%   Visual Acuity Right Eye Distance:   Left Eye Distance:   Bilateral Distance:    Right Eye Near:   Left Eye Near:    Bilateral Near:     Physical Exam   UC Treatments / Results  Labs (all labs ordered are listed, but only abnormal results are displayed) Labs Reviewed - No data to display  EKG   Radiology No results found.  Procedures Procedures (including critical care time)  Medications Ordered in UC Medications - No data to display  Initial Impression / Assessment and Plan / UC Course  I have reviewed the triage vital signs and the nursing notes.  Pertinent labs & imaging results that were available during my care of the patient were reviewed by me and considered in my medical decision making (see chart for details).     Fall with trauma to the nose.  See below for specific discharge instructions that were reviewed with the patient and her friend.  We were able to get the bleeding of her nose to stop during the visit.  She is using an ice pack to help prevent further bleeding.  Given specific instructions on how to manage a nosebleed, see below.  She has contusions to her face and advised about using ice for 24 to 48 hours and then heat.  Nasal x-rays done and appear negative but we will contact her once radiology has reviewed the x-rays.  She has an abrasion under her  upper lip from where her teeth cut her lip.  Provided cefdinir, 300 mg  twice daily for 7 days to pick up if needed if this abrasion and cut becomes infected.  She has tramadol at home for use for shoulder pain and may use tramadol every 6 hours if needed for pain in her face.  Follow-up if symptoms do not improve, worsen or new symptoms occur. Final Clinical Impressions(s) / UC Diagnoses   Final diagnoses:  Epistaxis  Nose pain  Fall on same level from slipping, tripping or stumbling, initial encounter  Abrasion of oral cavity, initial encounter     Discharge Instructions      Fall with trauma to nose.  Instructed on how to manage a nosebleed: She will hold pressure high on her nose for 5 minutes.  The location needs to be a pressure point that prevents her from inhaling through her nose.  If there is any bleeding after 5 minutes she will do the same thing for another 5 minutes.  She will repeat the pressure on her nose for 5-minute intervals until the bleeding stops.  If the bleeding persist more than 20 to 30 minutes she should call 911 and go to an emergency room.  When she has the bleeding stopped she should put an ice pack on her nose 30 minutes on 30 minutes off for several hours to help reduce the risk of future recurrence of the nosebleed.  She has contusions to her nose and face or bruising.  She should put ice packs on her face half an hour on, half an hour off for 24 to 48 hours to reduce the bruising.  After 24 to 48 hours she may use warm compresses, hot washcloths or heating pads.  Cautioned to be careful about not causing any burns.  The heat will help reduce the bruising and swelling.  The bruising will fade downhill on her face and may even fade into her cheeks jaw or chin or neck.  Again warm compresses will help reduce the bruising over time.  Use tramadol as previously provided for her shoulder, every 6 hours if needed for pain in her nose.  I will contact her once radiology has read the films but they appear negative to me.  She has an oral  abrasion where her teeth cut her upper lip inside the mouth.  Provided cefdinir, 300 mg twice daily for 7 days, to use if needed for infection.  Follow-up with family doctor if symptoms do not improve, worsen or new symptoms occur.     ED Prescriptions     Medication Sig Dispense Auth. Provider   cefdinir (OMNICEF) 300 MG capsule Take 1 capsule (300 mg total) by mouth 2 (two) times daily after a meal for 7 days. 14 capsule Prescilla Sours, FNP      I have reviewed the PDMP during this encounter.   Prescilla Sours, FNP 01/20/24 [phone]

## 2024-01-22 IMAGING — MR MR KNEE*R* W/O CM
6 series · 40 of 40 positions shown · non-contrast
Comparison: None Available.

CLINICAL DATA: Right knee pain, fall 4 weeks ago.

EXAM:
MRI OF THE RIGHT KNEE WITHOUT CONTRAST
TECHNIQUE: Multiplanar, multisequence MR imaging of the right was performed. No
intravenous contrast was administered.

[Series 6: T2 fat-sat · axial · right · 4.0mm · 0.50mm/px · z∈[-45,+108]mm · 7 of 36 slices shown (1 of 3)]
[im 1/36]
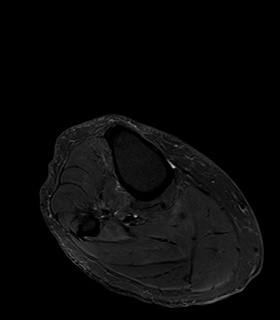
[im 6/36]
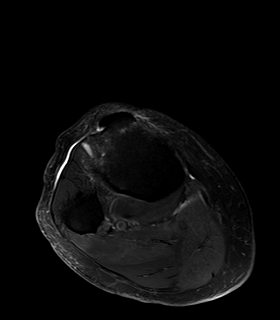
[im 12/36]
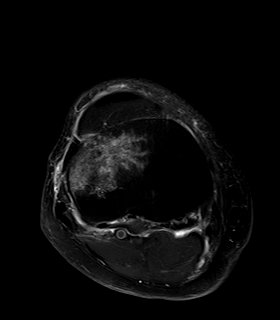
[im 18/36]
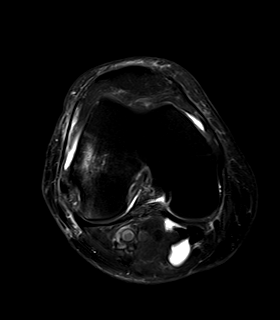
[im 24/36]
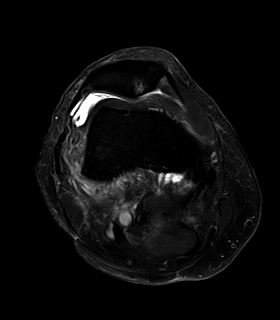
[im 30/36]
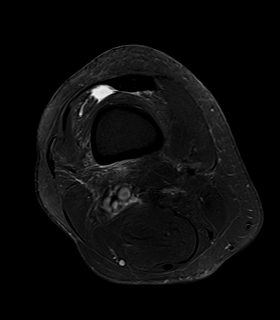
[im 36/36]
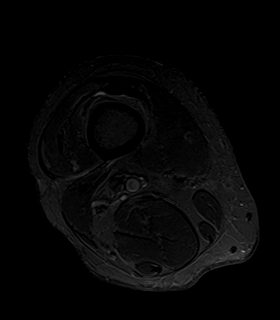

[Series 7: T2 fat-sat · coronal · right · 4.0mm · 0.47mm/px · 7 of 28 slices shown (2 of 3)]
[im 1/28]
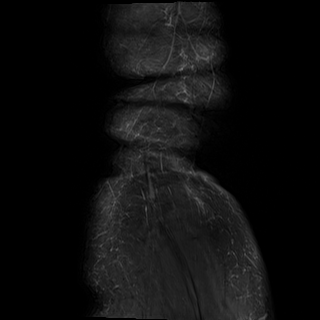
[im 5/28]
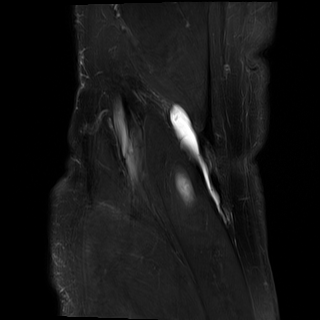
[im 10/28]
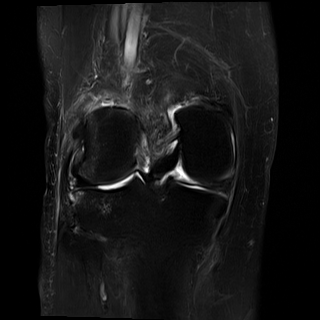
[im 14/28]
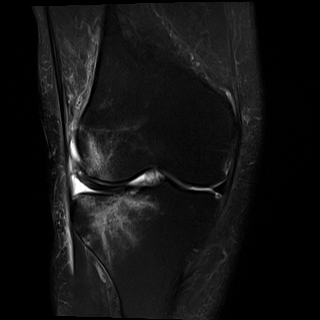
[im 19/28]
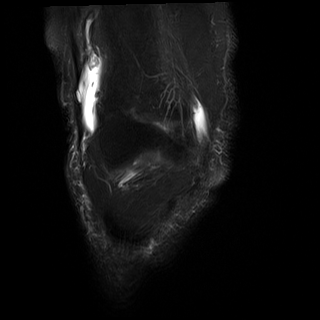
[im 23/28]
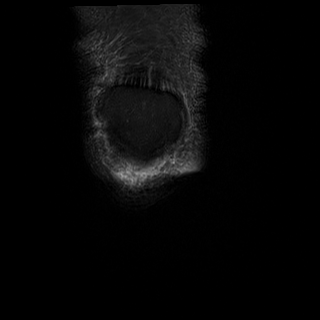
[im 28/28]
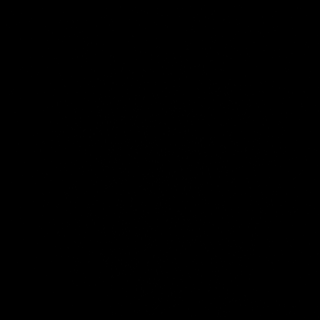

[Series 8: T1 · coronal · right · 4.0mm · 0.47mm/px · 7 of 28 slices shown]
[im 1/28]
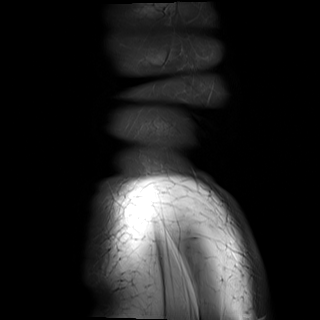
[im 5/28]
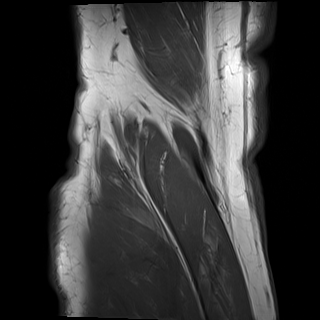
[im 10/28]
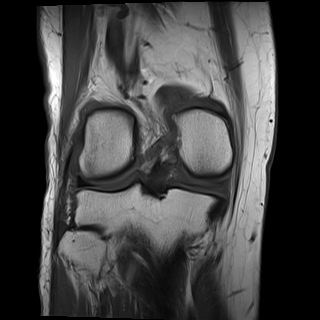
[im 14/28]
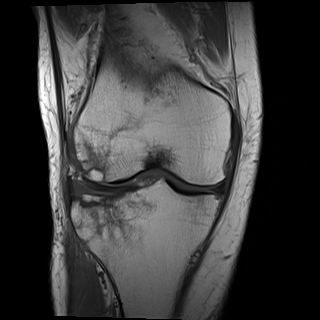
[im 19/28]
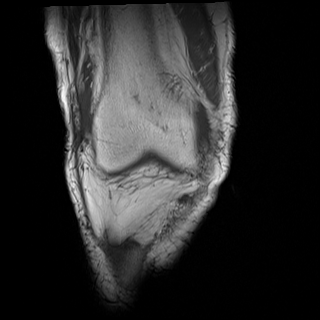
[im 23/28]
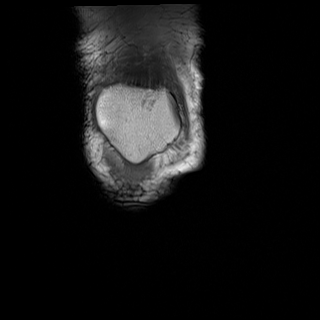
[im 28/28]
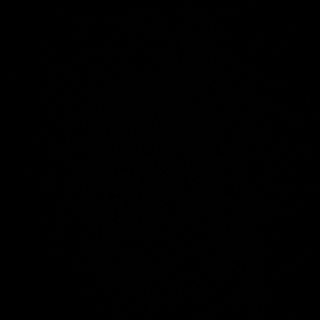

[Series 9: PD fat-sat · coronal · right · 3.0mm · 0.47mm/px · 7 of 28 slices shown (1 of 2)]
[im 1/28]
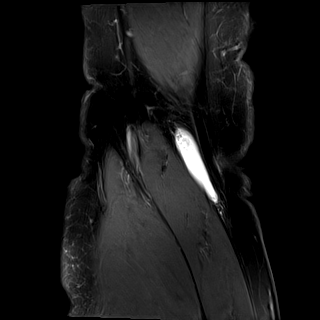
[im 5/28]
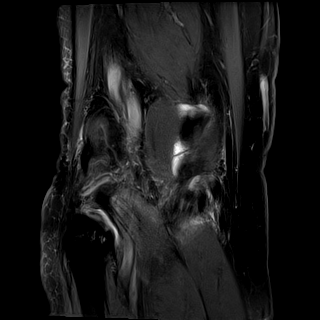
[im 10/28]
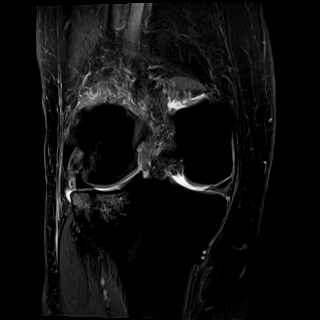
[im 14/28]
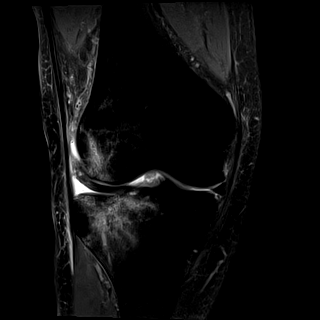
[im 19/28]
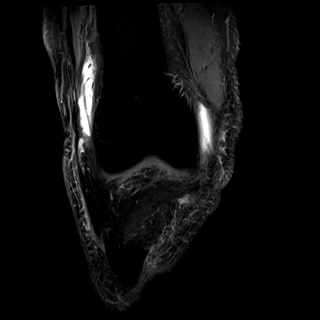
[im 23/28]
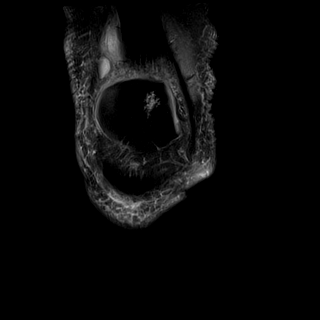
[im 28/28]
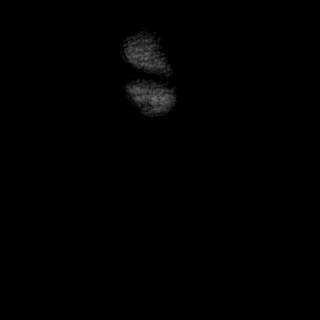

[Series 10: PD fat-sat · sagittal · right · 3.0mm · 0.39mm/px · 6 of 25 slices shown (2 of 2)]
[im 1/25]
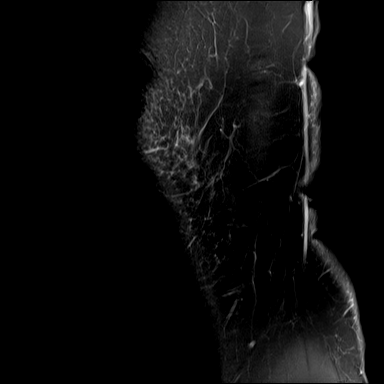
[im 5/25]
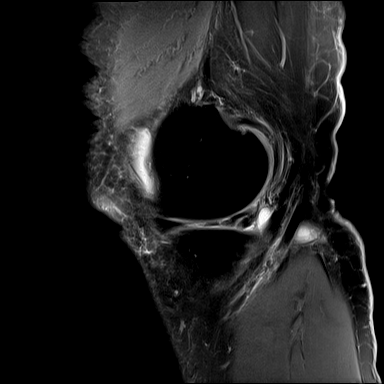
[im 10/25]
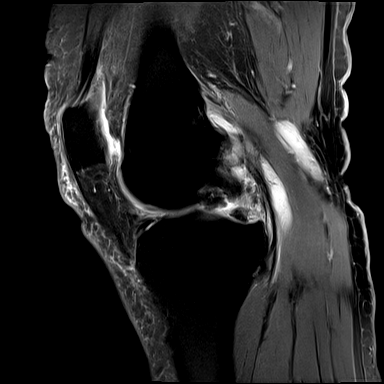
[im 15/25]
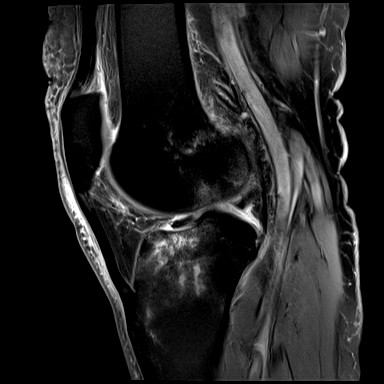
[im 20/25]
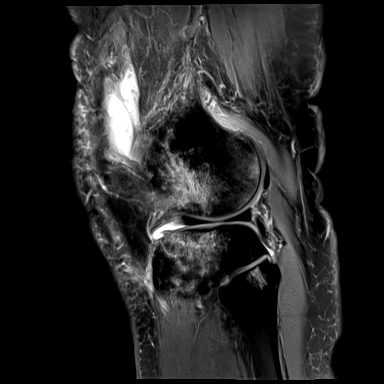
[im 25/25]
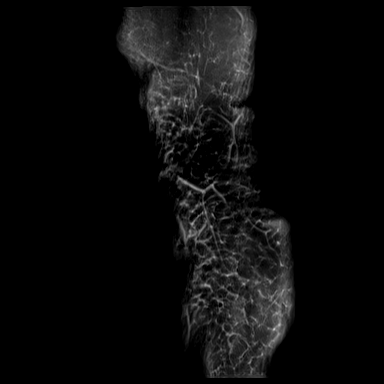

[Series 11: T2 fat-sat · sagittal · right · 3.0mm · 0.39mm/px · 6 of 25 slices shown (3 of 3)]
[im 1/25]
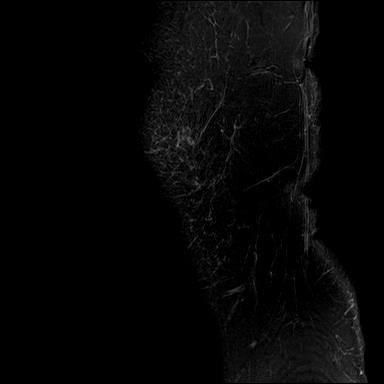
[im 5/25]
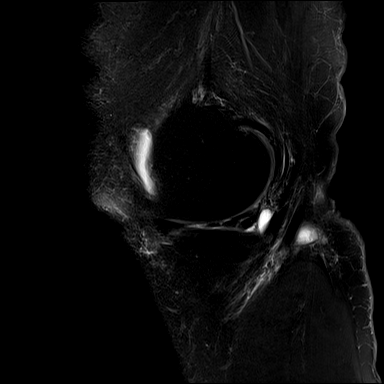
[im 10/25]
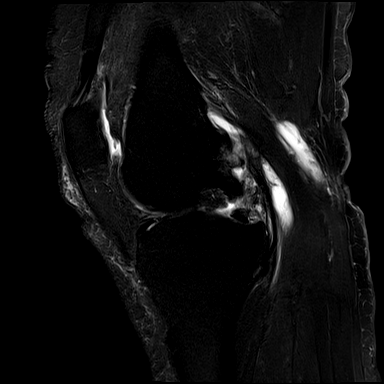
[im 15/25]
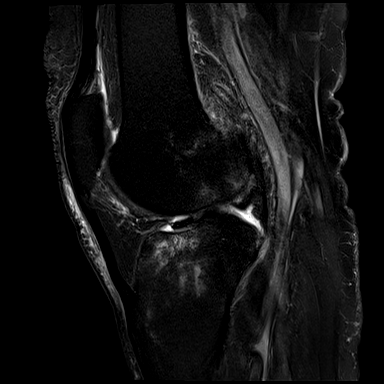
[im 20/25]
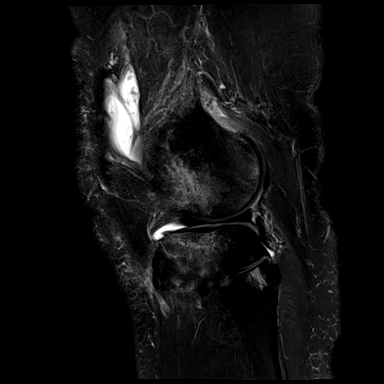
[im 25/25]
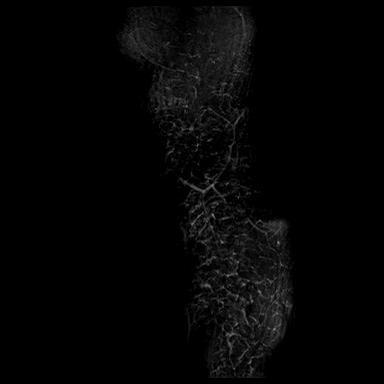

[40 of 40 positions shown; findings below may reference images not displayed]

FINDINGS: MENISCI

Medial: Horizontal oblique undersurface tear of the body/posterior
horn.

Lateral: Intact.

LIGAMENTS

Cruciates: ACL and PCL are intact.

Collaterals: Medial collateral ligament is intact. Lateral
collateral ligament complex is intact. Thickening of the lateral
collateral ligament.

CARTILAGE

Patellofemoral: Full-thickness cartilage defect at the patellar apex
with subchondral edema.

Medial:  Full-thickness cartilage loss at the weight-bearing area.

Lateral: Focal full-thickness cartilage defect at the weight-bearing
area with marked subchondral edema of the lateral tibial plateau and
lateral femoral condyle

JOINT: Moderate joint effusion. Normal Akuume Kempu. Synovial
thickening concerning for synovitis.

POPLITEAL FOSSA: Popliteus tendon is intact. Moderate Baker's cyst
measuring approximately 2.1 x 1.4 x 4.6 cm

EXTENSOR MECHANISM: Intact quadriceps tendon. Intact patellar
tendon. Intact lateral patellar retinaculum. Intact medial patellar
retinaculum. Intact MPFL.

BONES: Linear subchondral fracture and surrounding bone marrow edema
of the lateral femoral condyle and anterior aspect of the lateral
tibial plateau consistent with mildly displaced fracture. There is
also bone marrow edema of the proximal fibula and adjacent tibia at
the proximal tibiofibular joint concerning for bone contusions.

Other: No fluid collection or hematoma. Muscles are normal.
IMPRESSION: 1. Mildly displaced subchondral fracture of the lateral tibial
plateau and adjacent lateral femoral condyle, likely secondary to
recent trauma with marked surrounding edema.

2. Bone contusions of the proximal fibula and adjacent tibia at the
level of the tibiofibular joint without evidence of displacement.

3. Horizontal oblique tear of the body/posterior horn of the medial
meniscus.

4. Moderate patellofemoral osteoarthritis with focal full-thickness
defect at the patellar apex and subchondral edema.

5.  Moderate knee joint effusion with synovitis.

6. Moderate Baker's cyst measuring approximately 2.1 x 1.4 x 4.6 cm.

7.  Cruciate and collateral ligaments are intact.

## 2024-01-29 ENCOUNTER — Ambulatory Visit (INDEPENDENT_AMBULATORY_CARE_PROVIDER_SITE_OTHER)
Admission: RE | Admit: 2024-01-29 | Discharge: 2024-01-29 | Disposition: A | Discharge status: Home / Self Care | Payer: Medicare PPO | Source: Ambulatory Visit | Attending: Specialist | Admitting: Specialist

## 2024-01-29 DIAGNOSIS — Z1231 Encounter for screening mammogram for malignant neoplasm of breast: Secondary | ICD-10-CM | POA: Diagnosis not present

## 2024-01-29 DIAGNOSIS — M810 Age-related osteoporosis without current pathological fracture: Secondary | ICD-10-CM | POA: Diagnosis not present
